# Patient Record
Sex: Female | Born: 1935 | Race: White | Hispanic: No | Smoking: Never smoker
Health system: Southern US, Community
[De-identification: ages and names within clinical notes are randomized; demographics above are authoritative.]

## PROBLEM LIST (undated history)

## (undated) DIAGNOSIS — Z9889 Other specified postprocedural states: Secondary | ICD-10-CM

## (undated) DIAGNOSIS — R42 Dizziness and giddiness: Secondary | ICD-10-CM

## (undated) DIAGNOSIS — G4733 Obstructive sleep apnea (adult) (pediatric): Secondary | ICD-10-CM

## (undated) DIAGNOSIS — E039 Hypothyroidism, unspecified: Secondary | ICD-10-CM

## (undated) DIAGNOSIS — C4490 Unspecified malignant neoplasm of skin, unspecified: Secondary | ICD-10-CM

## (undated) DIAGNOSIS — T8859XA Other complications of anesthesia, initial encounter: Secondary | ICD-10-CM

## (undated) DIAGNOSIS — J449 Chronic obstructive pulmonary disease, unspecified: Secondary | ICD-10-CM

## (undated) DIAGNOSIS — H2513 Age-related nuclear cataract, bilateral: Secondary | ICD-10-CM

## (undated) DIAGNOSIS — H18513 Endothelial corneal dystrophy, bilateral: Secondary | ICD-10-CM

## (undated) DIAGNOSIS — I517 Cardiomegaly: Secondary | ICD-10-CM

## (undated) DIAGNOSIS — I4891 Unspecified atrial fibrillation: Secondary | ICD-10-CM

## (undated) DIAGNOSIS — I10 Essential (primary) hypertension: Secondary | ICD-10-CM

## (undated) DIAGNOSIS — M1990 Unspecified osteoarthritis, unspecified site: Secondary | ICD-10-CM

## (undated) DIAGNOSIS — R112 Nausea with vomiting, unspecified: Secondary | ICD-10-CM

## (undated) DIAGNOSIS — M419 Scoliosis, unspecified: Secondary | ICD-10-CM

## (undated) DIAGNOSIS — E785 Hyperlipidemia, unspecified: Secondary | ICD-10-CM

## (undated) DIAGNOSIS — C801 Malignant (primary) neoplasm, unspecified: Secondary | ICD-10-CM

## (undated) DIAGNOSIS — R0609 Other forms of dyspnea: Secondary | ICD-10-CM

## (undated) DIAGNOSIS — H353190 Nonexudative age-related macular degeneration, unspecified eye, stage unspecified: Secondary | ICD-10-CM

## (undated) DIAGNOSIS — Z7901 Long term (current) use of anticoagulants: Secondary | ICD-10-CM

## (undated) DIAGNOSIS — T4145XA Adverse effect of unspecified anesthetic, initial encounter: Secondary | ICD-10-CM

## (undated) HISTORY — PX: JOINT REPLACEMENT: SHX530

## (undated) HISTORY — PX: BUNIONECTOMY: SHX129

## (undated) HISTORY — PX: OTHER SURGICAL HISTORY: SHX169

## (undated) HISTORY — PX: FRACTURE SURGERY: SHX138

## (undated) HISTORY — PX: CATARACT EXTRACTION, BILATERAL: SHX1313

## (undated) HISTORY — PX: EYE SURGERY: SHX253

---

## 2017-07-05 ENCOUNTER — Other Ambulatory Visit: Payer: Self-pay | Admitting: Orthopedic Surgery

## 2017-07-05 DIAGNOSIS — S46009S Unspecified injury of muscle(s) and tendon(s) of the rotator cuff of unspecified shoulder, sequela: Secondary | ICD-10-CM

## 2017-07-05 DIAGNOSIS — M19011 Primary osteoarthritis, right shoulder: Secondary | ICD-10-CM

## 2017-07-20 ENCOUNTER — Ambulatory Visit
Admission: RE | Admit: 2017-07-20 | Discharge: 2017-07-20 | Disposition: A | Discharge status: Home / Self Care | Payer: Medicare Other | Source: Ambulatory Visit | Attending: Orthopedic Surgery | Admitting: Orthopedic Surgery

## 2017-07-20 DIAGNOSIS — M19011 Primary osteoarthritis, right shoulder: Secondary | ICD-10-CM | POA: Insufficient documentation

## 2017-07-20 DIAGNOSIS — S46009S Unspecified injury of muscle(s) and tendon(s) of the rotator cuff of unspecified shoulder, sequela: Secondary | ICD-10-CM

## 2017-07-20 DIAGNOSIS — M25411 Effusion, right shoulder: Secondary | ICD-10-CM | POA: Insufficient documentation

## 2017-07-27 ENCOUNTER — Ambulatory Visit: Payer: Self-pay | Admitting: Orthopedic Surgery

## 2017-08-10 ENCOUNTER — Encounter
Admission: RE | Admit: 2017-08-10 | Discharge: 2017-08-10 | Disposition: A | Discharge status: Home / Self Care | Payer: Medicare Other | Source: Ambulatory Visit | Attending: Orthopedic Surgery | Admitting: Orthopedic Surgery

## 2017-08-10 ENCOUNTER — Other Ambulatory Visit: Payer: Self-pay

## 2017-08-10 ENCOUNTER — Encounter: Payer: Self-pay | Admitting: *Deleted

## 2017-08-10 DIAGNOSIS — Z01812 Encounter for preprocedural laboratory examination: Secondary | ICD-10-CM | POA: Insufficient documentation

## 2017-08-10 DIAGNOSIS — Z01818 Encounter for other preprocedural examination: Secondary | ICD-10-CM | POA: Insufficient documentation

## 2017-08-10 HISTORY — DX: Other specified postprocedural states: Z98.890

## 2017-08-10 HISTORY — DX: Malignant (primary) neoplasm, unspecified: C80.1

## 2017-08-10 HISTORY — DX: Chronic obstructive pulmonary disease, unspecified: J44.9

## 2017-08-10 HISTORY — DX: Essential (primary) hypertension: I10

## 2017-08-10 HISTORY — DX: Other specified postprocedural states: R11.2

## 2017-08-10 HISTORY — DX: Scoliosis, unspecified: M41.9

## 2017-08-10 HISTORY — DX: Adverse effect of unspecified anesthetic, initial encounter: T41.45XA

## 2017-08-10 HISTORY — DX: Unspecified osteoarthritis, unspecified site: M19.90

## 2017-08-10 HISTORY — DX: Hypothyroidism, unspecified: E03.9

## 2017-08-10 HISTORY — DX: Other complications of anesthesia, initial encounter: T88.59XA

## 2017-08-10 LAB — URINALYSIS, ROUTINE W REFLEX MICROSCOPIC
BACTERIA UA: NONE SEEN
BILIRUBIN URINE: NEGATIVE
Glucose, UA: NEGATIVE mg/dL
Ketones, ur: NEGATIVE mg/dL
Leukocytes, UA: NEGATIVE
Nitrite: NEGATIVE
PROTEIN: NEGATIVE mg/dL
Specific Gravity, Urine: 1.011 (ref 1.005–1.030)
pH: 6 (ref 5.0–8.0)

## 2017-08-10 LAB — BASIC METABOLIC PANEL
Anion gap: 8 (ref 5–15)
BUN: 21 mg/dL — AB (ref 6–20)
CALCIUM: 9 mg/dL (ref 8.9–10.3)
CO2: 27 mmol/L (ref 22–32)
CREATININE: 0.9 mg/dL (ref 0.44–1.00)
Chloride: 103 mmol/L (ref 101–111)
GFR calc Af Amer: >60 mL/min (ref >60)
GFR, EST NON AFRICAN AMERICAN: 59 mL/min — AB (ref >60)
Glucose, Bld: 98 mg/dL (ref 65–99)
Potassium: 4.1 mmol/L (ref 3.5–5.1)
SODIUM: 138 mmol/L (ref 135–145)

## 2017-08-10 LAB — CBC
HEMATOCRIT: 46.8 % (ref 35.0–47.0)
HEMOGLOBIN: 15.6 g/dL (ref 12.0–16.0)
MCH: 29.9 pg (ref 26.0–34.0)
MCHC: 33.4 g/dL (ref 32.0–36.0)
MCV: 89.7 fL (ref 80.0–100.0)
Platelets: 196 10*3/uL (ref 150–440)
RBC: 5.21 MIL/uL — ABNORMAL HIGH (ref 3.80–5.20)
RDW: 15 % — ABNORMAL HIGH (ref 11.5–14.5)
WBC: 5.4 10*3/uL (ref 3.6–11.0)

## 2017-08-10 LAB — PROTIME-INR
INR: 0.88
PROTHROMBIN TIME: 11.9 s (ref 11.4–15.2)

## 2017-08-10 LAB — APTT: aPTT: 26 seconds (ref 24–36)

## 2017-08-10 NOTE — Patient Instructions (Signed)
Your procedure is scheduled on: 08/22/17 Report to Day Surgery. [facility]OND FLOOR To find out your arrival time please call [phone] between 1PM - 3PM on 08/19/17.  Remember: Instructions that are not followed completely may result in serious medical risk, up to and including death, or upon the discretion of your surgeon and anesthesiologist your surgery may need to be rescheduled.     _X__ 1. Do not eat food after midnight the night before your procedure.                 No gum chewing or hard candies. You may drink clear liquids up to 2 hours                 before you are scheduled to arrive for your surgery- DO not drink clear                 liquids within 2 hours of the start of your surgery.                 Clear Liquids include:  water, apple juice without pulp, clear carbohydrate                 drink such as Clearfast of Gartorade, Black Coffee or Tea (Do not add                 anything to coffee or tea).  __X__2.  On the morning of surgery brush your teeth with toothpaste and water, you                 may rinse your mouth with mouthwash if you wish.  Do not swallow any              toothpaste of mouthwash.     _X__ 3.  No Alcohol for 24 hours before or after surgery.   _X__ 4.  Do Not Smoke or use e-cigarettes For 24 Hours Prior to Your Surgery.                 Do not use any chewable tobacco products for at least 6 hours prior to                 surgery.  ____  5.  Bring all medications with you on the day of surgery if instructed.   __X__  6.  Notify your doctor if there is any change in your medical condition      (cold, fever, infections).     Do not wear jewelry, make-up, hairpins, clips or nail polish. Do not wear lotions, powders, or perfumes. You may wear deodorant. Do not shave 48 hours prior to surgery. Men may shave face and neck. Do not bring valuables to the hospital.    [facility] is not responsible for any belongings or  valuables.  Contacts, dentures or bridgework may not be worn into surgery. Leave your suitcase in the car. After surgery it may be brought to your room. For patients admitted to the hospital, discharge time is determined by your treatment team.   Patients discharged the day of surgery will not be allowed to drive home.   Please read over the following fact sheets that you were given:   Surgical Site Infection Prevention   _X ___ Take these medicines the morning of surgery with A SIP OF WATER:    1. LEVOTHYROXINE  2.   3.   4.  5.  6.  ____  Fleet Enema (as directed)   _X___ Use CHG Soap as directed  __X__ Use inhalers on the day of surgery  AND BRING  ____ Stop metformin 2 days prior to surgery    ____ Take 1/2 of usual insulin dose the night before surgery. No insulin the morning          of surgery.   _X___ Stop Coumadin/Plavix/aspirin on   STOP ASPIRIN 1 WEEK PREOP  _X___ Stop Anti-inflammatories on  STOP IBUPROFEN AND MOBIC 1 WEEK PREOP   ____ Stop supplements until after surgery.    ____ Bring C-Pap to the hospital.

## 2017-08-10 NOTE — Anesthesia Pain Management Evaluation Note (Signed)
CLEARANCE NOTE FROM PULMONARY AND CARDIAC ON CHART. 06/02/17 STRESS  And 2/29/29 echo in care everywhere [facility] under other tab

## 2017-08-21 MED ORDER — CEFAZOLIN SODIUM-DEXTROSE 2-4 GM/100ML-% IV SOLN
2.0000 g | INTRAVENOUS | Status: AC
  Administered 2017-08-22: 2 g via INTRAVENOUS

## 2017-08-21 MED ORDER — TRANEXAMIC ACID 1000 MG/10ML IV SOLN
1000.0000 mg | INTRAVENOUS | Status: AC
  Administered 2017-08-22: 1000 mg via INTRAVENOUS
  Filled 2017-08-21: qty 10

## 2017-08-22 ENCOUNTER — Inpatient Hospital Stay: Payer: Medicare Other

## 2017-08-22 ENCOUNTER — Inpatient Hospital Stay: Payer: Medicare Other | Admitting: Anesthesiology

## 2017-08-22 ENCOUNTER — Encounter
Admission: RE | Disposition: A | Discharge status: Home / Self Care | Payer: Self-pay | Source: Home / Self Care | Attending: Orthopedic Surgery

## 2017-08-22 ENCOUNTER — Other Ambulatory Visit: Payer: Self-pay

## 2017-08-22 ENCOUNTER — Inpatient Hospital Stay
Admission: RE | Admit: 2017-08-22 | Discharge: 2017-08-23 | DRG: 483 | Disposition: A | Discharge status: Home / Self Care | Payer: Medicare Other | Attending: Orthopedic Surgery | Admitting: Orthopedic Surgery

## 2017-08-22 DIAGNOSIS — Z09 Encounter for follow-up examination after completed treatment for conditions other than malignant neoplasm: Secondary | ICD-10-CM

## 2017-08-22 DIAGNOSIS — Z885 Allergy status to narcotic agent status: Secondary | ICD-10-CM

## 2017-08-22 DIAGNOSIS — J449 Chronic obstructive pulmonary disease, unspecified: Secondary | ICD-10-CM | POA: Diagnosis present

## 2017-08-22 DIAGNOSIS — E039 Hypothyroidism, unspecified: Secondary | ICD-10-CM | POA: Diagnosis present

## 2017-08-22 DIAGNOSIS — M25511 Pain in right shoulder: Secondary | ICD-10-CM | POA: Diagnosis present

## 2017-08-22 DIAGNOSIS — I10 Essential (primary) hypertension: Secondary | ICD-10-CM | POA: Diagnosis present

## 2017-08-22 DIAGNOSIS — M19011 Primary osteoarthritis, right shoulder: Secondary | ICD-10-CM | POA: Diagnosis present

## 2017-08-22 DIAGNOSIS — Z96611 Presence of right artificial shoulder joint: Secondary | ICD-10-CM

## 2017-08-22 DIAGNOSIS — Z881 Allergy status to other antibiotic agents status: Secondary | ICD-10-CM

## 2017-08-22 DIAGNOSIS — M4156 Other secondary scoliosis, lumbar region: Secondary | ICD-10-CM | POA: Diagnosis present

## 2017-08-22 HISTORY — PX: REVERSE SHOULDER ARTHROPLASTY: SHX5054

## 2017-08-22 SURGERY — ARTHROPLASTY, SHOULDER, TOTAL, REVERSE
Anesthesia: General | Laterality: Right

## 2017-08-22 MED ORDER — ALBUTEROL SULFATE (2.5 MG/3ML) 0.083% IN NEBU: 2.5000 mg | INHALATION_SOLUTION | Freq: Four times a day (QID) | RESPIRATORY_TRACT | Status: DC | PRN

## 2017-08-22 MED ORDER — MORPHINE SULFATE (PF) 2 MG/ML IV SOLN: 0.5000 mg | INTRAVENOUS | Status: DC | PRN

## 2017-08-22 MED ORDER — ONDANSETRON HCL 4 MG PO TABS: 4.0000 mg | ORAL_TABLET | Freq: Four times a day (QID) | ORAL | Status: DC | PRN

## 2017-08-22 MED ORDER — FENTANYL CITRATE (PF) 100 MCG/2ML IJ SOLN
INTRAMUSCULAR | Status: AC
  Administered 2017-08-22: 50 ug
  Filled 2017-08-22: qty 2

## 2017-08-22 MED ORDER — LIDOCAINE HCL (CARDIAC) PF 100 MG/5ML IV SOSY
PREFILLED_SYRINGE | INTRAVENOUS | Status: DC | PRN
  Administered 2017-08-22: 40 mg via INTRAVENOUS

## 2017-08-22 MED ORDER — PROPOFOL 500 MG/50ML IV EMUL
INTRAVENOUS | Status: AC
  Filled 2017-08-22: qty 50

## 2017-08-22 MED ORDER — ALBUTEROL SULFATE HFA 108 (90 BASE) MCG/ACT IN AERS
INHALATION_SPRAY | RESPIRATORY_TRACT | Status: AC
  Filled 2017-08-22: qty 6.7

## 2017-08-22 MED ORDER — LIDOCAINE HCL (PF) 1 % IJ SOLN
INTRAMUSCULAR | Status: AC
  Filled 2017-08-22: qty 5

## 2017-08-22 MED ORDER — ALBUTEROL SULFATE HFA 108 (90 BASE) MCG/ACT IN AERS: 2.0000 | INHALATION_SPRAY | Freq: Four times a day (QID) | RESPIRATORY_TRACT | Status: DC | PRN

## 2017-08-22 MED ORDER — PHENYLEPHRINE HCL 10 MG/ML IJ SOLN
INTRAMUSCULAR | Status: DC | PRN
  Administered 2017-08-22 (×2): 100 ug via INTRAVENOUS
  Administered 2017-08-22: 200 ug via INTRAVENOUS

## 2017-08-22 MED ORDER — FENTANYL CITRATE (PF) 100 MCG/2ML IJ SOLN: 25.0000 ug | INTRAMUSCULAR | Status: DC | PRN

## 2017-08-22 MED ORDER — GABAPENTIN 300 MG PO CAPS
300.0000 mg | ORAL_CAPSULE | Freq: Three times a day (TID) | ORAL | Status: DC
  Administered 2017-08-22 – 2017-08-23 (×5): 300 mg via ORAL
  Filled 2017-08-22 (×5): qty 1

## 2017-08-22 MED ORDER — SODIUM CHLORIDE 0.9 % IR SOLN
Status: DC | PRN
  Administered 2017-08-22: 20 mL

## 2017-08-22 MED ORDER — SEVOFLURANE IN SOLN
RESPIRATORY_TRACT | Status: AC
  Filled 2017-08-22: qty 250

## 2017-08-22 MED ORDER — OXYCODONE HCL 5 MG PO TABS
5.0000 mg | ORAL_TABLET | ORAL | Status: DC | PRN
  Administered 2017-08-23: 5 mg via ORAL
  Filled 2017-08-22: qty 1

## 2017-08-22 MED ORDER — CEFAZOLIN SODIUM-DEXTROSE 2-4 GM/100ML-% IV SOLN
2.0000 g | Freq: Four times a day (QID) | INTRAVENOUS | Status: AC
  Administered 2017-08-22 – 2017-08-23 (×3): 2 g via INTRAVENOUS
  Filled 2017-08-22 (×3): qty 100

## 2017-08-22 MED ORDER — ROCURONIUM BROMIDE 50 MG/5ML IV SOLN
INTRAVENOUS | Status: AC
  Filled 2017-08-22: qty 1

## 2017-08-22 MED ORDER — METOCLOPRAMIDE HCL 10 MG PO TABS: 5.0000 mg | ORAL_TABLET | Freq: Three times a day (TID) | ORAL | Status: DC | PRN

## 2017-08-22 MED ORDER — PHENYLEPHRINE HCL 10 MG/ML IJ SOLN
INTRAMUSCULAR | Status: AC
  Filled 2017-08-22: qty 1

## 2017-08-22 MED ORDER — LEVOTHYROXINE SODIUM 50 MCG PO TABS
50.0000 ug | ORAL_TABLET | Freq: Every day | ORAL | Status: DC
  Administered 2017-08-23: 50 ug via ORAL
  Filled 2017-08-22: qty 1

## 2017-08-22 MED ORDER — SODIUM CHLORIDE 0.9 % IV SOLN
INTRAVENOUS | Status: DC | PRN
  Administered 2017-08-22: 50 ug/min via INTRAVENOUS

## 2017-08-22 MED ORDER — OXYMETAZOLINE HCL 0.05 % NA SOLN
1.0000 | NASAL | Status: DC | PRN
  Filled 2017-08-22: qty 15

## 2017-08-22 MED ORDER — BUPIVACAINE-EPINEPHRINE (PF) 0.25% -1:200000 IJ SOLN
INTRAMUSCULAR | Status: DC | PRN
  Administered 2017-08-22: 12 mL via PERINEURAL

## 2017-08-22 MED ORDER — BUPIVACAINE-EPINEPHRINE (PF) 0.25% -1:200000 IJ SOLN
INTRAMUSCULAR | Status: AC
  Filled 2017-08-22: qty 30

## 2017-08-22 MED ORDER — PROPOFOL 500 MG/50ML IV EMUL
INTRAVENOUS | Status: DC | PRN
  Administered 2017-08-22: 20 ug/kg/min via INTRAVENOUS

## 2017-08-22 MED ORDER — SUCCINYLCHOLINE CHLORIDE 20 MG/ML IJ SOLN
INTRAMUSCULAR | Status: AC
  Filled 2017-08-22: qty 1

## 2017-08-22 MED ORDER — MECLIZINE HCL 25 MG PO TABS
25.0000 mg | ORAL_TABLET | Freq: Three times a day (TID) | ORAL | Status: DC | PRN
Start: 2017-08-22 — End: 2017-08-23
  Administered 2017-08-23: 25 mg via ORAL
  Filled 2017-08-22 (×2): qty 1

## 2017-08-22 MED ORDER — BACITRACIN 50000 UNITS IM SOLR
INTRAMUSCULAR | Status: AC
  Filled 2017-08-22: qty 2

## 2017-08-22 MED ORDER — FENTANYL CITRATE (PF) 100 MCG/2ML IJ SOLN
INTRAMUSCULAR | Status: DC | PRN
  Administered 2017-08-22: 100 ug via INTRAVENOUS

## 2017-08-22 MED ORDER — DEXAMETHASONE SODIUM PHOSPHATE 10 MG/ML IJ SOLN
INTRAMUSCULAR | Status: AC
  Filled 2017-08-22: qty 1

## 2017-08-22 MED ORDER — ROPIVACAINE HCL 5 MG/ML IJ SOLN
INTRAMUSCULAR | Status: DC | PRN
  Administered 2017-08-22: 30 mL via EPIDURAL

## 2017-08-22 MED ORDER — FENTANYL CITRATE (PF) 100 MCG/2ML IJ SOLN
INTRAMUSCULAR | Status: AC
  Filled 2017-08-22: qty 2

## 2017-08-22 MED ORDER — ACETAMINOPHEN 500 MG PO TABS
1000.0000 mg | ORAL_TABLET | Freq: Once | ORAL | Status: AC
  Administered 2017-08-22: 1000 mg via ORAL

## 2017-08-22 MED ORDER — GABAPENTIN 300 MG PO CAPS
300.0000 mg | ORAL_CAPSULE | Freq: Once | ORAL | Status: AC
  Administered 2017-08-22: 300 mg via ORAL

## 2017-08-22 MED ORDER — LACTATED RINGERS IV SOLN
INTRAVENOUS | Status: DC
  Administered 2017-08-22 – 2017-08-23 (×2): via INTRAVENOUS

## 2017-08-22 MED ORDER — ONDANSETRON HCL 4 MG/2ML IJ SOLN
4.0000 mg | Freq: Four times a day (QID) | INTRAMUSCULAR | Status: DC | PRN
Start: 2017-08-22 — End: 2017-08-23

## 2017-08-22 MED ORDER — ONDANSETRON HCL 4 MG/2ML IJ SOLN
INTRAMUSCULAR | Status: DC | PRN
  Administered 2017-08-22: 4 mg via INTRAVENOUS

## 2017-08-22 MED ORDER — CHLORHEXIDINE GLUCONATE 4 % EX LIQD: 60.0000 mL | Freq: Once | CUTANEOUS | Status: DC

## 2017-08-22 MED ORDER — PROPOFOL 10 MG/ML IV BOLUS
INTRAVENOUS | Status: DC | PRN
  Administered 2017-08-22: 120 mg via INTRAVENOUS

## 2017-08-22 MED ORDER — ONDANSETRON HCL 4 MG/2ML IJ SOLN: 4.0000 mg | Freq: Once | INTRAMUSCULAR | Status: DC | PRN

## 2017-08-22 MED ORDER — ALBUTEROL SULFATE HFA 108 (90 BASE) MCG/ACT IN AERS
INHALATION_SPRAY | RESPIRATORY_TRACT | Status: DC | PRN
  Administered 2017-08-22: 6 via RESPIRATORY_TRACT

## 2017-08-22 MED ORDER — SUGAMMADEX SODIUM 200 MG/2ML IV SOLN
INTRAVENOUS | Status: AC
  Filled 2017-08-22: qty 2

## 2017-08-22 MED ORDER — ASPIRIN EC 81 MG PO TBEC
81.0000 mg | DELAYED_RELEASE_TABLET | Freq: Every day | ORAL | Status: DC
  Administered 2017-08-22 – 2017-08-23 (×2): 81 mg via ORAL
  Filled 2017-08-22 (×2): qty 1

## 2017-08-22 MED ORDER — ROPIVACAINE HCL 5 MG/ML IJ SOLN
INTRAMUSCULAR | Status: AC
  Filled 2017-08-22: qty 30

## 2017-08-22 MED ORDER — SUGAMMADEX SODIUM 200 MG/2ML IV SOLN
INTRAVENOUS | Status: DC | PRN
  Administered 2017-08-22: 200 mg via INTRAVENOUS

## 2017-08-22 MED ORDER — ACETAMINOPHEN 500 MG PO TABS
500.0000 mg | ORAL_TABLET | Freq: Four times a day (QID) | ORAL | Status: AC
  Administered 2017-08-22 – 2017-08-23 (×3): 500 mg via ORAL
  Filled 2017-08-22 (×4): qty 1

## 2017-08-22 MED ORDER — SODIUM CHLORIDE FLUSH 0.9 % IV SOLN
INTRAVENOUS | Status: AC
  Filled 2017-08-22: qty 40

## 2017-08-22 MED ORDER — FAMOTIDINE 20 MG PO TABS
20.0000 mg | ORAL_TABLET | Freq: Once | ORAL | Status: AC
  Administered 2017-08-22: 20 mg via ORAL

## 2017-08-22 MED ORDER — DEXAMETHASONE SODIUM PHOSPHATE 10 MG/ML IJ SOLN
INTRAMUSCULAR | Status: DC | PRN
  Administered 2017-08-22: 10 mg via INTRAVENOUS

## 2017-08-22 MED ORDER — METOCLOPRAMIDE HCL 5 MG/ML IJ SOLN: 5.0000 mg | Freq: Three times a day (TID) | INTRAMUSCULAR | Status: DC | PRN

## 2017-08-22 MED ORDER — ROCURONIUM BROMIDE 100 MG/10ML IV SOLN
INTRAVENOUS | Status: DC | PRN
  Administered 2017-08-22: 30 mg via INTRAVENOUS
  Administered 2017-08-22: 20 mg via INTRAVENOUS

## 2017-08-22 MED ORDER — DOCUSATE SODIUM 100 MG PO CAPS
100.0000 mg | ORAL_CAPSULE | Freq: Two times a day (BID) | ORAL | Status: DC
  Administered 2017-08-22 – 2017-08-23 (×3): 100 mg via ORAL
  Filled 2017-08-22 (×3): qty 1

## 2017-08-22 MED ORDER — KETOROLAC TROMETHAMINE 15 MG/ML IJ SOLN
7.5000 mg | Freq: Four times a day (QID) | INTRAMUSCULAR | Status: AC
  Administered 2017-08-22 – 2017-08-23 (×4): 7.5 mg via INTRAVENOUS
  Filled 2017-08-22 (×5): qty 1

## 2017-08-22 MED ORDER — PHENOL 1.4 % MT LIQD
1.0000 | OROMUCOSAL | Status: DC | PRN
  Filled 2017-08-22: qty 177

## 2017-08-22 MED ORDER — MENTHOL 3 MG MT LOZG
1.0000 | LOZENGE | OROMUCOSAL | Status: DC | PRN
  Filled 2017-08-22: qty 9

## 2017-08-22 MED ORDER — ACETAMINOPHEN 325 MG PO TABS: 325.0000 mg | ORAL_TABLET | Freq: Four times a day (QID) | ORAL | Status: DC | PRN

## 2017-08-22 MED ORDER — LACTATED RINGERS IV SOLN: INTRAVENOUS | Status: DC

## 2017-08-22 MED ORDER — LIDOCAINE HCL (PF) 2 % IJ SOLN
INTRAMUSCULAR | Status: AC
  Filled 2017-08-22: qty 10

## 2017-08-22 MED ORDER — LACTATED RINGERS IV SOLN
INTRAVENOUS | Status: DC
  Administered 2017-08-22: 1000 mL via INTRAVENOUS

## 2017-08-22 MED ORDER — PROPOFOL 500 MG/50ML IV EMUL: INTRAVENOUS | Status: DC | PRN

## 2017-08-22 MED ORDER — ONDANSETRON HCL 4 MG/2ML IJ SOLN
INTRAMUSCULAR | Status: AC
  Filled 2017-08-22: qty 2

## 2017-08-22 SURGICAL SUPPLY — 68 items
1.5 Glensphere ×2 IMPLANT
2.5 Drill Bit ×2 IMPLANT
225503003 ×2 IMPLANT
BIT DRILL 170X2.5X (BIT) ×1 IMPLANT
BIT DRL 170X2.5X (BIT) ×1
BLADE BOVIE TIP EXT 4 (BLADE) ×2 IMPLANT
BLADE SAW 1 (BLADE) ×2 IMPLANT
BNDG COHESIVE 4X5 TAN STRL (GAUZE/BANDAGES/DRESSINGS) ×2 IMPLANT
BOWL CEMENT MIX W/ADAPTER (MISCELLANEOUS) IMPLANT
BRUSH SCRUB EZ  4% CHG (MISCELLANEOUS) ×2
BRUSH SCRUB EZ 4% CHG (MISCELLANEOUS) ×2 IMPLANT
CANISTER SUCT 1200ML W/VALVE (MISCELLANEOUS) ×2 IMPLANT
CANISTER SUCT 3000ML PPV (MISCELLANEOUS) ×4 IMPLANT
CAPT SHLDR REVTOTAL 1 ×2 IMPLANT
CHLORAPREP W/TINT 26ML (MISCELLANEOUS) ×2 IMPLANT
COVER MAYO STAND STRL (DRAPES) IMPLANT
CRADLE LAMINECT ARM (MISCELLANEOUS) ×2 IMPLANT
DRAPE IMP U-DRAPE 54X76 (DRAPES) ×4 IMPLANT
DRAPE INCISE IOBAN 66X45 STRL (DRAPES) IMPLANT
DRAPE INCISE IOBAN 66X60 STRL (DRAPES) IMPLANT
DRAPE SHEET LG 3/4 BI-LAMINATE (DRAPES) ×4 IMPLANT
DRAPE TABLE BACK 80X90 (DRAPES) ×2 IMPLANT
DRAPE U-SHAPE 47X51 STRL (DRAPES) ×2 IMPLANT
DRILL 2.5 (BIT) ×1
DRSG TEGADERM 6X8 (GAUZE/BANDAGES/DRESSINGS) ×4 IMPLANT
ELECT REM PT RETURN 9FT ADLT (ELECTROSURGICAL) ×2
ELECTRODE REM PT RTRN 9FT ADLT (ELECTROSURGICAL) ×1 IMPLANT
GAUZE PETRO XEROFOAM 1X8 (MISCELLANEOUS) ×2 IMPLANT
GAUZE SPONGE 4X4 12PLY STRL (GAUZE/BANDAGES/DRESSINGS) IMPLANT
GLOVE INDICATOR 8.0 STRL GRN (GLOVE) ×2 IMPLANT
GLOVE SURG ORTHO 8.0 STRL STRW (GLOVE) ×2 IMPLANT
GOWN STRL REUS W/ TWL LRG LVL3 (GOWN DISPOSABLE) ×2 IMPLANT
GOWN STRL REUS W/ TWL XL LVL3 (GOWN DISPOSABLE) ×1 IMPLANT
GOWN STRL REUS W/TWL LRG LVL3 (GOWN DISPOSABLE) ×2
GOWN STRL REUS W/TWL XL LVL3 (GOWN DISPOSABLE) ×1
GUIDE XTEND SHOULDER (MISCELLANEOUS) ×1 IMPLANT
HOOD PEEL AWAY FLYTE STAYCOOL (MISCELLANEOUS) ×6 IMPLANT
IV NS 1000ML (IV SOLUTION) ×1
IV NS 1000ML BAXH (IV SOLUTION) ×1 IMPLANT
KIT STABILIZATION SHOULDER (MISCELLANEOUS) ×2 IMPLANT
KIT TURNOVER KIT A (KITS) ×2 IMPLANT
MASK FACE SPIDER DISP (MASK) ×2 IMPLANT
MAT BLUE FLOOR 46X72 FLO (MISCELLANEOUS) ×2 IMPLANT
NDL MAYO CATGUT SZ5 (NEEDLE)
NDL SAFETY ECLIPSE 18X1.5 (NEEDLE) IMPLANT
NDL SUT 5 .5 CRC TPR PNT MAYO (NEEDLE) IMPLANT
NEEDLE HYPO 18GX1.5 SHARP (NEEDLE)
NEEDLE MAYO 6 CRC TAPER PT (NEEDLE) ×2 IMPLANT
NS IRRIG 1000ML POUR BTL (IV SOLUTION) ×2 IMPLANT
PACK ARTHROSCOPY SHOULDER (MISCELLANEOUS) ×2 IMPLANT
PIN GUIDE GLENOPHERE 1.5MX300M (PIN) ×2 IMPLANT
PULSAVAC PLUS IRRIG FAN TIP (DISPOSABLE) ×2
SHOULDER XTEND GUIDE (MISCELLANEOUS) ×2
SLING ARM LRG DEEP (SOFTGOODS) ×2 IMPLANT
SPONGE LAP 18X18 RF (DISPOSABLE) ×2 IMPLANT
STAPLER SKIN PROX 35W (STAPLE) ×2 IMPLANT
STRAP SAFETY 5IN WIDE (MISCELLANEOUS) ×4 IMPLANT
STRIP CLOSURE SKIN 1/2X4 (GAUZE/BANDAGES/DRESSINGS) IMPLANT
SUT TICRON 2-0 30IN 311381 (SUTURE) IMPLANT
SUT VIC AB 0 CT2 27 (SUTURE) ×2 IMPLANT
SUT VIC AB 2-0 CT1 18 (SUTURE) ×4 IMPLANT
SUT VIC AB PLUS 45CM 1-MO-4 (SUTURE) IMPLANT
SYR 10ML LL (SYRINGE) ×2 IMPLANT
SYR TOOMEY 50ML (SYRINGE) ×2 IMPLANT
SYRINGE IRR TOOMEY STRL 70CC (SYRINGE) ×2 IMPLANT
TAPE SUT 30 1/2 CRC GREEN (SUTURE) ×4 IMPLANT
TIP FAN IRRIG PULSAVAC PLUS (DISPOSABLE) ×1 IMPLANT
WATER STERILE IRR 1000ML POUR (IV SOLUTION) ×4 IMPLANT

## 2017-08-22 NOTE — Transfer of Care (Signed)
Immediate Anesthesia Transfer of Care Note  Patient: Sonya Cameron  Procedure(s) Performed: REVERSE SHOULDER ARTHROPLASTY (Right )  Patient Location: PACU  Anesthesia Type:General  Level of Consciousness: drowsy  Airway & Oxygen Therapy: spontaneous, face mask   Post-op Assessment: Report given to RN and Post -op Vital signs reviewed and stable  Post vital signs: Reviewed and stable  Last Vitals:  Vitals Value Taken Time  BP 110/70 08/22/2017 10:37 AM  Temp    Pulse 74 08/22/2017 10:38 AM  Resp 21 08/22/2017 10:38 AM  SpO2 99 % 08/22/2017 10:38 AM    Last Pain:  Vitals:   08/22/17 0730  TempSrc:   PainSc: 0-No pain         Complications: No apparent anesthesia complications

## 2017-08-22 NOTE — Op Note (Signed)
08/22/2017  10:44 AM  Patient:   Sonya Cameron  Pre-Op Diagnosis:   localized primary osteoarthritis right shoulder  Post-Op Diagnosis:   Same  Procedure:   Reverse right total shoulder arthroplasty.  Surgeon:   Kurtis Bushman, MD  Assistant:   Carlynn Spry, PA-C  Anesthesia:   General endotracheal with an interscalene block placed preoperatively by the anesthesiologist.  Findings:   As above.  Complications:   None  EBL:  50 cc  Drains:   None  Closure:   Staples  Implants:   All press-fit J&J Delta Xtend Metaglene, Glenosphere Standard, Humeral PE Cup +6 mm, Eccentric Epiphysis Size small, Porocoat standard stem  Brief Clinical Note:   The patient has failed multiple conservative treatments for painful rotator tear arthropathy, including injections, medications and therapy. The patient presents at this time for a reverse right total shoulder arthroplasty.  Procedure:   The patient underwent placement of an interscalene block by the anesthesiologist in the preoperative holding area before being brought into the operating room and lain in the supine position. The patient then underwent general endotracheal intubation and anesthesia before a Foley catheter was inserted and the patient repositioned in the beach chair position using the beach chair positioner. The right shoulder and upper extremity were prepped with ChloraPrep solution before being draped sterilely. Preoperative antibiotics were administered. A standard anterior approach to the shoulder was made through an approximately 4-5 inch incision. The incision was carried down through the subcutaneous tissues to expose the deltopectoral fascia. The interval between the deltoid and pectoralis muscles was identified and this plane developed, retracting the cephalic vein laterally with the deltoid muscle. The conjoined tendon was identified. Its lateral margin was dissected and the Kolbel self-retraining retractor inserted. The "three  sisters" were identified and cauterized. Bursal tissues were removed to improve visualization. The subscapularis tendon was released from its attachment to the lesser tuberosity 1 cm proximal to its insertion and several tagging sutures placed. The inferior capsule was released with care after identifying and protecting the axillary nerve. The proximal humeral cut was made at approximately 10 of retroversion using the extra-medullary guide.   Attention was redirected to the glenoid. The labrum was debrided circumferentially before the center of the glenoid was marked with electrocautery. The guidewire was drilled into the glenoid neck using the appropriate guide. After verifying its position, it was overreamed with the mini-baseplate reamer to create a flat surface. The permanent mini-baseplate was impacted into place. It was stabilized with four peripheral screws. Locking screws were placed superiorly and inferiorly while nonlocking screws were placed anteriorly and posteriorly. The permanent 36 mm glenosphere was then impacted into place and its Morse taper locking mechanism verified using manual distraction.  Attention was directed to the humeral side. The humeral canal was reamed sequentially beginning with the end-cutting reamer then progressing from a 6 mm reamer up to a 12 mm reamer. This provided excellent circumferential chatter. The canal was broached. This was left in place and a trial reduction performed using the standard trial humeral platform. The arm demonstrated excellent range of motion as the hand could be brought across the chest to the opposite shoulder and brought to the top of the patient's head and to the patient's ear. The shoulder appeared stable throughout this range of motion. The joint was dislocated and the trial components removed. The permanent 12 mm stem was impacted into place with care taken to maintain the appropriate version. The shoulder was relocated using two finger  pressure and again placed through a range of motion with the findings as described above.  The wound was copiously irrigated with bacitracin saline solution using the jet lavage system. The subscapularis tendon was reapproximated using 1 mm cottony dacron with Mason-Allen interrupted sutures. The deltopectoral interval was closed using #0 Vicryl interrupted sutures before the subcutaneous tissues were closed using 2-0 Vicryl interrupted sutures. The skin was closed using staples. A sterile occlusive dressing was applied to the wound before the arm was placed into a shoulder sling. The patient was then transferred back to a hospital bed before being awakened, extubated, and returned to the recovery room in satisfactory condition after tolerating the procedure well.

## 2017-08-22 NOTE — Anesthesia Procedure Notes (Signed)
Anesthesia Regional Block: Interscalene brachial plexus block   Pre-Anesthetic Checklist: ,, timeout performed, Correct Patient, Correct Site, Correct Laterality, Correct Procedure, Correct Position, site marked, Risks and benefits discussed,  Surgical consent,  Pre-op evaluation,  At surgeon's request and post-op pain management  Laterality: Right  Prep: Maximum Sterile Barrier Precautions used, chloraprep, alcohol swabs       Needles:  Injection technique: Single-shot  Needle Type: Stimiplex     Needle Length: 5cm  Needle Gauge: 21     Additional Needles:   Procedures:, nerve stimulator,,, ultrasound used (permanent image in chart),,,,   Nerve Stimulator or Paresthesia:  Response: biceps flexion, 0.5 mA,   Additional Responses:   Narrative:  Start time: 08/22/2017 7:20 AM End time: 08/22/2017 7:25 AM Injection made incrementally with aspirations every 5 mL.  Performed by: Personally  Anesthesiologist: Alvin Critchley, MD  Additional Notes: Time out was called.  The patient was in semireclining position with a roll under her right shoulder.  The patient was adequately medicated and the right neck was prepped and draped in sterile fashion.  A US probe was used to ID the nerves in a typical stoplight position.  A skin wheal was made lateral to the US probe with 1% Lidocaine plain.  A 21 G stimuplex needle was advanced to the plexus with a fade at 0.5 mAmps.  Incremental injection of 30 cc of 0.5% Ropivacaine plain.  The injection was easy with no resistance and no pain on injection.  The patient tolerated the procedure well.

## 2017-08-22 NOTE — Progress Notes (Signed)
Pt alert and oriented X4 up in chair. IV infusing LR at 65ml/hr. Pt on 2L O2 acute. Scheduled pain medication given. Right arm in sling. Ice pack applied. No complaints at this time.

## 2017-08-22 NOTE — Anesthesia Procedure Notes (Signed)
Procedure Name: Intubation Date/Time: 08/22/2017 7:45 AM Performed by: Jonna Clark, CRNA Pre-anesthesia Checklist: Patient identified, Patient being monitored, Timeout performed, Emergency Drugs available and Suction available Patient Re-evaluated:Patient Re-evaluated prior to induction Oxygen Delivery Method: Circle system utilized Preoxygenation: Pre-oxygenation with 100% oxygen Induction Type: IV induction Ventilation: Mask ventilation without difficulty Laryngoscope Size: Mac and 3 Grade View: Grade I Tube type: Oral Tube size: 7.0 mm Number of attempts: 1 Airway Equipment and Method: Stylet Placement Confirmation: ETT inserted through vocal cords under direct vision,  positive ETCO2 and breath sounds checked- equal and bilateral Secured at: 21 cm Tube secured with: Tape Dental Injury: Teeth and Oropharynx as per pre-operative assessment

## 2017-08-22 NOTE — H&P (Signed)
The patient has been re-examined, and the chart reviewed, and there have been no interval changes to the documented history and physical.  Plan a right reverse shoulder arthroplasty today.  Anesthesia is consulted regarding a peripheral nerve block for post-operative pain.  The risks, benefits, and alternatives have been discussed at length, and the patient is willing to proceed.

## 2017-08-22 NOTE — Anesthesia Preprocedure Evaluation (Signed)
Anesthesia Evaluation  Patient identified by MRN, date of birth, ID band Patient awake    Reviewed: Allergy & Precautions, NPO status , Patient's Chart, lab work & pertinent test results  History of Anesthesia Complications (+) PONV and history of anesthetic complications  Airway Mallampati: II  TM Distance: >3 FB     Dental   Pulmonary COPD,  COPD inhaler,    Pulmonary exam normal        Cardiovascular hypertension, Normal cardiovascular exam     Neuro/Psych negative neurological ROS  negative psych ROS   GI/Hepatic negative GI ROS, Neg liver ROS,   Endo/Other  Hypothyroidism   Renal/GU negative Renal ROS  negative genitourinary   Musculoskeletal  (+) Arthritis , Osteoarthritis,    Abdominal Normal abdominal exam  (+)   Peds negative pediatric ROS (+)  Hematology negative hematology ROS (+)   Anesthesia Other Findings   Reproductive/Obstetrics                             Anesthesia Physical Anesthesia Plan  ASA: III  Anesthesia Plan: General   Post-op Pain Management:  Regional for Post-op pain   Induction: Intravenous  PONV Risk Score and Plan:   Airway Management Planned: Oral ETT  Additional Equipment:   Intra-op Plan:   Post-operative Plan: Extubation in OR  Informed Consent: I have reviewed the patients History and Physical, chart, labs and discussed the procedure including the risks, benefits and alternatives for the proposed anesthesia with the patient or authorized representative who has indicated his/her understanding and acceptance.   Dental advisory given  Plan Discussed with: CRNA and Surgeon  Anesthesia Plan Comments:         Anesthesia Quick Evaluation

## 2017-08-22 NOTE — Progress Notes (Addendum)
Physical Therapy Evaluation Patient Details Name: Sonya Cameron MRN: [id number] DOB: 1934-11-05 Today's Date: 08/22/2017   History of Present Illness  Pt underwent elective R reverse TSR without reported post-op complications. PMH includes COPD, OA, hypothyroidims, and HTN. PT evaluation performed on POD#0.  Clinical Impression  Pt admitted with above diagnosis. Pt currently with functional limitations due to the deficits listed below (see PT Problem List). Pt is modified independent with bed mobility and requires increased time to perform as well as assist from bed rail. Pt able to stand without external assist from therapist. She is steady in standing without assistive device however does require increase in time to come to standing. Attempted to take a few steps however pt immediately reports that she feels dizzy so deferred ambulation/stairs until next session. Pt assisted to recliner. Orthostatic vitals already obtained during position changes prior to walking and were negative. Pt reports continued numbness/tingling in RUE at time of PT evaluation. Issued handout but deferred exercises with patient until next session once she has full sensation. Pt will benefit from PT services to address deficits in strength, balance, and mobility in order to return to full function at home.      Follow Up Recommendations Outpatient PT    Equipment Recommendations  None recommended by PT;Other (comment)(May need a tub bench to assist shower transfers)    Recommendations for Other Services       Precautions / Restrictions Precautions Precautions: Shoulder Type of Shoulder Precautions: No AROM of shoulder, no combined add/IR Shoulder Interventions: Shoulder sling/immobilizer Precaution Booklet Issued: Yes (comment) Required Braces or Orthoses: Sling Restrictions Weight Bearing Restrictions: Yes RUE Weight Bearing: Non weight bearing      Mobility  Bed Mobility Overal bed mobility: Modified  Independent             General bed mobility comments: Use of L bed rail when exiting toward L side. Increase in time and effor but able to perform without external assist. HOB elevated  Transfers Overall transfer level: Needs assistance Equipment used: Straight cane Transfers: Sit to/from Stand Sit to Stand: Min guard         General transfer comment: Pt able to stand without external assist from therapist. She is steady in standing without assistive device. Increase in time required. Attempted to take a few steps however pt immediately reports that she feels dizzy so deferred until next session. Pt assisted to recliner. Orthostatic vitals already obtained during position changes prior to walking and were negative.   Ambulation/Gait             General Gait Details: Deferred on this date due to dizziness/lightheadedness in standing  Stairs            Wheelchair Mobility    Modified Rankin (Stroke Patients Only)       Balance Overall balance assessment: Needs assistance Sitting-balance support: No upper extremity supported Sitting balance-Leahy Scale: Good     Standing balance support: No upper extremity supported Standing balance-Leahy Scale: Fair                               Pertinent Vitals/Pain Pain Assessment: 0-10 Pain Score: 3  Pain Location: Reports general lower body pain. Will not answer question directly when asked about shoulder pain Pain Intervention(s): Monitored during session    [place] expects to be discharged to:: Private residence Living Arrangements: Children(Daughter) Available Help at Discharge: Family Type of Home:  House Home Access: Level entry     Home Layout: Multi-level;Able to live on main level with bedroom/bathroom Home Equipment: Gilford Rile - 2 wheels;Cane - single point;Bedside commode      Prior Function Level of Independence: Needs assistance   Gait / Transfers Assistance Needed:  Modified independent ambulation with single point cane in RUE prior to surgery. 2 falls in the last 12 months  ADL's / Homemaking Assistance Needed: Mostly independent with ADLs but requires intermittent assist from daughter washing her back. Requires assist with IADLs        Hand Dominance   Dominant Hand: Right    Extremity/Trunk Assessment   Upper Extremity Assessment Upper Extremity Assessment: RUE deficits/detail RUE Deficits / Details: Pt reports numbness/tingling in R hand during session. R grip strength is strong and pt able to flex and extend all digits of R hand. LUE has functional strength during bed mobility and transfers    Lower Extremity Assessment Lower Extremity Assessment: Overall WFL for tasks assessed       Communication   Communication: No difficulties  Cognition Arousal/Alertness: Lethargic;Suspect due to medications Behavior During Therapy: [facility] for tasks assessed/performed Overall Cognitive Status: Within Functional Limits for tasks assessed                                        General Comments      Exercises     Assessment/Plan    PT Assessment Patient needs continued PT services  PT Problem List Decreased strength;Decreased range of motion;Decreased activity tolerance;Decreased balance;Decreased mobility       PT Treatment Interventions DME instruction;Gait training;Stair training;Functional mobility training;Therapeutic activities;Therapeutic exercise;Balance training;Neuromuscular re-education    PT Goals (Current goals can be found in the Care Plan section)  Acute Rehab PT Goals Patient Stated Goal: Return to prior level of function at home PT Goal Formulation: With patient Time For Goal Achievement: 09/05/17 Potential to Achieve Goals: Good    Frequency BID   Barriers to discharge        Co-evaluation               AM-PAC PT "6 Clicks" Daily Activity  Outcome Measure Difficulty turning over in bed  (including adjusting bedclothes, sheets and blankets)?: A Little Difficulty moving from lying on back to sitting on the side of the bed? : A Little Difficulty sitting down on and standing up from a chair with arms (e.g., wheelchair, bedside commode, etc,.)?: A Little Help needed moving to and from a bed to chair (including a wheelchair)?: A Little Help needed walking in hospital room?: A Lot Help needed climbing 3-5 steps with a railing? : A Lot 6 Click Score: 16    End of Session Equipment Utilized During Treatment: Gait belt Activity Tolerance: Other (comment)(Limited by lightheadedness and lethargy) Patient left: in chair;with call bell/phone within reach;with chair alarm set;with family/visitor present;with SCD's reapplied   PT Visit Diagnosis: Unsteadiness on feet (R26.81);History of falling (Z91.81);Pain Pain - Right/Left: Right Pain - part of body: Shoulder    Time: 1607-3710 PT Time Calculation (min) (ACUTE ONLY): 33 min   Charges:   PT Evaluation $PT Eval Low Complexity: 1 Low     PT G Codes:        Lyndel Safe Ellie Bryand PT, DPT    Gael Delude 08/22/2017, 2:56 PM

## 2017-08-22 NOTE — Anesthesia Post-op Follow-up Note (Signed)
Anesthesia QCDR form completed.        

## 2017-08-22 NOTE — Progress Notes (Signed)
Chaplain responded to an OR for an AD. Pt said she was dozing as I entered. She said she told the team she had an AD and said she didn't need one. Pt said she wanted to get som rest. Chaplain help Pt find her remote for the TV. The TV was on [employer]. She said she watches that when nothing else to watch. Chaplain stated to engage Pt  asking about her garden. Pt said she used to have a garden and took her cucumbers to the farmers market  But found they lasted longer when pickled. The NT entered room and pt s attention was distracted. Will come by again to talk about her garden.    08/22/17 1300  Clinical Encounter Type  Visited With Patient  Visit Type Spiritual support  Referral From Nurse  Spiritual Encounters  Spiritual Needs Brochure

## 2017-08-23 LAB — BASIC METABOLIC PANEL
Anion gap: 9 (ref 5–15)
BUN: 17 mg/dL (ref 6–20)
CALCIUM: 8.3 mg/dL — AB (ref 8.9–10.3)
CHLORIDE: 103 mmol/L (ref 101–111)
CO2: 24 mmol/L (ref 22–32)
CREATININE: 0.83 mg/dL (ref 0.44–1.00)
GFR calc Af Amer: >60 mL/min (ref >60)
GFR calc non Af Amer: >60 mL/min (ref >60)
Glucose, Bld: 127 mg/dL — ABNORMAL HIGH (ref 65–99)
Potassium: 3.8 mmol/L (ref 3.5–5.1)
Sodium: 136 mmol/L (ref 135–145)

## 2017-08-23 LAB — HEMOGLOBIN AND HEMATOCRIT, BLOOD
HCT: 39.9 % (ref 35.0–47.0)
HEMOGLOBIN: 13.6 g/dL (ref 12.0–16.0)

## 2017-08-23 MED ORDER — OXYCODONE-ACETAMINOPHEN 5-325 MG PO TABS: 1.0000 | ORAL_TABLET | ORAL | 0 refills | Status: AC | PRN

## 2017-08-23 MED ORDER — DOCUSATE SODIUM 100 MG PO CAPS: 100.0000 mg | ORAL_CAPSULE | Freq: Two times a day (BID) | ORAL | 0 refills | Status: DC

## 2017-08-23 MED ORDER — MECLIZINE HCL 25 MG PO TABS: 25.0000 mg | ORAL_TABLET | Freq: Two times a day (BID) | ORAL | 3 refills | Status: AC | PRN

## 2017-08-23 NOTE — Evaluation (Addendum)
Occupational Therapy Evaluation Patient Details Name: Sonya Cameron MRN: [id number] DOB: 09/02/1934 Today's Date: 08/23/2017    History of Present Illness 83yo female pt underwent elective R reverse TSR without reported post-op complications. PMH includes COPD, OA, hypothyroidims, and HTN. PT evaluation performed on POD#0. OT evaluation performed on POD#1.   Clinical Impression   Patient was seen for an OT evaluation this date. Pt lives with her daughter. Prior to surgery, pt was active and independent, enjoyed gardening. Pt will have family to assist at home while recovering. Pt has orders for RUE to be immobilized and will be NWBing per MD. Patient presents with impaired strength/ROM, pain, and balance. These impairments result in a decreased ability to perform self care tasks requiring min assist for bathing and dressing, sling mgt, and CGA for mobility with SPC. Pt instructed in ice mgt, sling mgt, elbow/wrist/hand ROM exercises for RUE, RUE precautions, adaptive strategies and AE for bathing/dressing/toileting/grooming, positioning and considerations for sleep, and home/routines modifications to maximize falls prevention, safety, and independence. Handout provided. OT adjusted sling to improve comfort, optimize positioning, and to maximize skin integrity/safety. Pt verbalized understanding of all education/training provided. Pt will benefit from skilled OT services to address these limitations and improve independence in daily tasks. Do not anticipate any follow up OT needs upon discharge. Follow up therapy as arranged by surgeon for shoulder rehab.      Follow Up Recommendations  No OT follow up;Follow surgeon's recommendation for DC plan and follow-up therapies    Equipment Recommendations  Tub/shower bench    Recommendations for Other Services       Precautions / Restrictions Precautions Precautions: Shoulder Type of Shoulder Precautions: No AROM of shoulder, no combined  add/IR Shoulder Interventions: Shoulder sling/immobilizer Precaution Booklet Issued: Yes (comment) Required Braces or Orthoses: Sling Restrictions Weight Bearing Restrictions: Yes RUE Weight Bearing: Non weight bearing      Mobility Bed Mobility Overal bed mobility: Modified Independent             General bed mobility comments: Received and left upright in recliner. Modified independent yesterday for bed mobility  Transfers Overall transfer level: Needs assistance Equipment used: Straight cane Transfers: Sit to/from Stand Sit to Stand: Min guard         General transfer comment: Pt able to stand without external assist from therapist. She is steady in standing without assistive device. Increase in time required. She continues to complain of some mild lightheadedness in standing but vitals have been taken multiple times without any issue.    Balance Overall balance assessment: Needs assistance Sitting-balance support: No upper extremity supported Sitting balance-Leahy Scale: Good     Standing balance support: No upper extremity supported Standing balance-Leahy Scale: Fair                             ADL either performed or assessed with clinical judgement   ADL Overall ADL's : Needs assistance/impaired Eating/Feeding: Modified independent Eating/Feeding Details (indicate cue type and reason): using non-dom hand Grooming: Minimal assistance;With caregiver independent assisting Grooming Details (indicate cue type and reason): using non-dom hand Upper Body Bathing: Sitting;Minimal assistance;With caregiver independent assisting   Lower Body Bathing: Minimal assistance;Sit to/from stand;With caregiver independent assisting   Upper Body Dressing : Sitting;Minimal assistance;With caregiver independent assisting Upper Body Dressing Details (indicate cue type and reason): pt educated in hemi techniques for UB dressing to maximize independence and safety Lower  Body Dressing: Sit to/from stand;Minimal assistance;With  caregiver independent assisting   Toilet Transfer: Min guard;Ambulation;BSC Toilet Transfer Details (indicate cue type and reason): SPC in L hand Toileting- Clothing Manipulation and Hygiene: Sitting/lateral lean;Independent       Functional mobility during ADLs: Min guard;Cane       Vision Baseline Vision/History: Wears glasses Wears Glasses: At all times Patient Visual Report: No change from baseline       Perception     Praxis      Pertinent Vitals/Pain Pain Assessment: 0-10 Pain Score: 5  Pain Location: R shoulder pain when moving Pain Descriptors / Indicators: Aching Pain Intervention(s): Limited activity within patient's tolerance;Monitored during session;Premedicated before session;Ice applied     Hand Dominance Right   Extremity/Trunk Assessment Upper Extremity Assessment Upper Extremity Assessment: RUE deficits/detail(LUE WFL) RUE Deficits / Details: R grip strong, elbow/hand/wrist ROM WFL, sensation intact   Lower Extremity Assessment Lower Extremity Assessment: Overall WFL for tasks assessed   Cervical / Trunk Assessment Cervical / Trunk Assessment: Normal   Communication Communication Communication: No difficulties   Cognition Arousal/Alertness: Awake/alert Behavior During Therapy: WFL for tasks assessed/performed Overall Cognitive Status: Within Functional Limits for tasks assessed                                     General Comments       Exercises Other Exercises: Pt educated in sling mgt, adaptive strategies for grooming, bathing, and dressing, AE, and elbow/wrist/hand exercises. Pt verbalized understanding. Handout provided.   Shoulder Instructions Shoulder Instructions Donning/doffing sling/immobilizer: Supervision/safety Correct positioning of sling/immobilizer: Modified independent ROM for elbow, wrist and digits of operated UE: Independent Sling wearing schedule  (on at all times/off for ADL's): DISH expects to be discharged to:: Private residence Living Arrangements: Children(Daughter) Available Help at Discharge: Family Type of Home: House Home Access: Level entry     Home Layout: Multi-level;Able to live on main level with bedroom/bathroom     Bathroom Shower/Tub: Teacher, early years/pre: Standard     Home Equipment: Environmental consultant - 2 wheels;Cane - single point;Bedside commode          Prior Functioning/Environment Level of Independence: Needs assistance  Gait / Transfers Assistance Needed: Modified independent ambulation with single point cane in RUE prior to surgery. 2 falls in the last 12 months ADL's / Homemaking Assistance Needed: Mostly independent with ADLs but requires intermittent assist from daughter washing her back. Requires assist with IADLs            OT Problem List: Decreased strength;Decreased knowledge of use of DME or AE;Decreased range of motion;Impaired UE functional use;Pain;Impaired balance (sitting and/or standing)      OT Treatment/Interventions: Self-care/ADL training;Therapeutic exercise;Therapeutic activities;DME and/or AE instruction;Patient/family education;Balance training    OT Goals(Current goals can be found in the care plan section) Acute Rehab OT Goals Patient Stated Goal: Return to prior level of function at home OT Goal Formulation: With patient Time For Goal Achievement: 09/06/17 Potential to Achieve Goals: Good ADL Goals Pt Will Perform Upper Body Dressing: with modified independence;sitting(using learned hemi technique, maintaining shlder precautions) Additional ADL Goal #1: Pt will doff/don sling with modified independence while maintaining shoulder precautions.  OT Frequency: Min 1X/week   Barriers to D/C:            Co-evaluation              AM-PAC PT "6 Clicks" Daily Activity  Outcome Measure Help from another person eating  meals?: None Help from another person taking care of personal grooming?: None Help from another person toileting, which includes using toliet, bedpan, or urinal?: A Little Help from another person bathing (including washing, rinsing, drying)?: A Little Help from another person to put on and taking off regular upper body clothing?: A Little Help from another person to put on and taking off regular lower body clothing?: A Little 6 Click Score: 20   End of Session Equipment Utilized During Treatment: Gait belt;Other (comment)(SPC)  Activity Tolerance: Patient tolerated treatment well Patient left: in chair;with call bell/phone within reach;with chair alarm set;Other (comment)(sling in place, PT in room for session)  OT Visit Diagnosis: Other abnormalities of gait and mobility (R26.89);Pain Pain - Right/Left: Right Pain - part of body: Shoulder                Time: 4142-3953 OT Time Calculation (min): 31 min Charges:  OT General Charges $OT Visit: 1 Visit OT Evaluation $OT Eval Low Complexity: 1 Low OT Treatments $Self Care/Home Management : 23-37 mins  Jeni Salles, MPH, MS, OTR/L ascom [phone] 08/23/17, 10:02 AM

## 2017-08-23 NOTE — Progress Notes (Signed)
VSS. Pt in no acute distress. RN explained discharge instructions to pt and pt verbalized understanding. IV removed. Pt wheeled to medical mall entrance and assisted into friends vehicle by RN.

## 2017-08-23 NOTE — Progress Notes (Signed)
Physical Therapy Treatment Patient Details Name: Sonya Cameron MRN: [id number] DOB: 08-02-1934 Today's Date: 08/23/2017    History of Present Illness 83yo female pt underwent elective R reverse TSR without reported post-op complications. PMH includes COPD, OA, hypothyroidims, and HTN. PT evaluation performed on POD#0. OT evaluation performed on POD#1.    PT Comments    Pt makes excellent progress with therapy during PM session. She is able to stand without external assist from therapist and is steady in standing without assistive device. She reports less "dizziness" this afternoon but still states she feels a little unsteady. Pt ambulates to rehab gym and then around RN station with single point cane. Gait speed is slightly increased from AM session and pt is more stable this afternoon. She complains of still feeling slightly unsteady but close to her baseline. VSS throughout ambulation without signs of respiratory distress or DOE. Pt able to safely ascend/descend 4 steps with step-to pattern and L rail. No LOB or unsteadiness noted and confidence improved from AM session. Pt taken through PROM R shoulder and AROM R elbow/wrist/hand exercises. She has met all PT goals for discharge and is safe to return home with support from daughter when medically appropriate. Pt will benefit from PT services to address deficits in strength, balance, and mobility in order to return to full function at home.    Follow Up Recommendations  Outpatient PT     Equipment Recommendations  None recommended by PT;Other (comment)(May need a tub bench to assist shower transfers)    Recommendations for Other Services       Precautions / Restrictions Precautions Precautions: Shoulder Type of Shoulder Precautions: No AROM of shoulder, no combined add/IR Shoulder Interventions: Shoulder sling/immobilizer Precaution Booklet Issued: Yes (comment) Required Braces or Orthoses: Sling Restrictions Weight Bearing  Restrictions: Yes RUE Weight Bearing: Non weight bearing    Mobility  Bed Mobility Overal bed mobility: Modified Independent             General bed mobility comments: Pt able to move from sitting to supine without assist from therapist. Minimal use of bed rail  Transfers Overall transfer level: Needs assistance Equipment used: Straight cane Transfers: Sit to/from Stand Sit to Stand: Min guard         General transfer comment: Pt able to stand without external assist from therapist. She is steady in standing without assistive device. She reports less "dizziness" this afternoon but still states she feels a little unsteady  Ambulation/Gait Ambulation/Gait assistance: Min guard Gait Distance (Feet): 250 Feet Assistive device: Straight cane       General Gait Details: Pt ambulates to rehab gym and then around RN station with single point cane. Gait speed is slightly increased and pt is more stable this afternoon. She complains of still feeling slightly unsteady but close to her baseline. VSS throughout ambulation distance   Stairs Stairs: Yes Stairs assistance: Min guard Stair Management: One rail Left Number of Stairs: 4 General stair comments: Pt able to safely ascend/descend 4 steps with step-to pattern and L rail during ascend. No LOB or unsteadiness noted and confidence improved from AM session   Wheelchair Mobility    Modified Rankin (Stroke Patients Only)       Balance Overall balance assessment: Needs assistance Sitting-balance support: No upper extremity supported Sitting balance-Leahy Scale: Good     Standing balance support: No upper extremity supported Standing balance-Leahy Scale: Fair  Cognition Arousal/Alertness: Awake/alert Behavior During Therapy: WFL for tasks assessed/performed Overall Cognitive Status: Within Functional Limits for tasks assessed                                         Exercises Shoulder Exercises Shoulder Flexion: PROM;Right;10 reps(To 90 degrees, no pain) Shoulder External Rotation: PROM;Right;10 reps(To neutral, no pain) Elbow Flexion: AROM;Right;10 reps Elbow Extension: AROM;Right;10 reps Wrist Flexion: AROM;Right;10 reps Wrist Extension: AROM;Right;10 reps Digit Composite Flexion: Strengthening;Right;10 reps;Other (comment)(squeezing a towel) ROM for elbow, wrist and digits of operated UE: Independent Sling wearing schedule (on at all times/off for ADL's): Independent    General Comments        Pertinent Vitals/Pain Pain Assessment: 0-10 Pain Score: 5  Pain Location: R shoulder pain at rest Pain Descriptors / Indicators: Aching Pain Intervention(s): Monitored during session    Home Living                      Prior Function            PT Goals (current goals can now be found in the care plan section) Acute Rehab PT Goals Patient Stated Goal: Return to prior level of function at home PT Goal Formulation: With patient Time For Goal Achievement: 09/05/17 Potential to Achieve Goals: Good Progress towards PT goals: Progressing toward goals    Frequency    BID      PT Plan Current plan remains appropriate    Co-evaluation              AM-PAC PT "6 Clicks" Daily Activity  Outcome Measure  Difficulty turning over in bed (including adjusting bedclothes, sheets and blankets)?: A Little Difficulty moving from lying on back to sitting on the side of the bed? : A Little Difficulty sitting down on and standing up from a chair with arms (e.g., wheelchair, bedside commode, etc,.)?: A Little Help needed moving to and from a bed to chair (including a wheelchair)?: A Little Help needed walking in hospital room?: A Little Help needed climbing 3-5 steps with a railing? : A Little 6 Click Score: 18    End of Session Equipment Utilized During Treatment: Gait belt Activity Tolerance: Patient tolerated treatment  well Patient left: with call bell/phone within reach;in bed;with bed alarm set   PT Visit Diagnosis: Unsteadiness on feet (R26.81);History of falling (Z91.81);Pain Pain - Right/Left: Right Pain - part of body: Shoulder     Time: 5320-2334 PT Time Calculation (min) (ACUTE ONLY): 24 min  Charges:  $Gait Training: 8-22 mins $Therapeutic Exercise: 8-22 mins                    G Codes:       Sonya Cameron PT, DPT     Sonya Cameron 08/23/2017, 4:27 PM

## 2017-08-23 NOTE — Discharge Instructions (Signed)
Wear sling at all times, including sleep.  You will need to use the sling for a total of 4 weeks following surgery.  Do not try and lift your arm up or away from your body for any reason.   Keep the dressing dry.  You may remove bandage in 3 days.  You may place Band-Aids over top of the incision.  May shower once dressing is removed in 3 days.  Remove sling carefully only for showers, leaving arm down by your side while in the shower.  +++ Make sure to take some pain medication this evening before you fall asleep, in preparation for the nerve block wearing off in the middle of the night.  If the the pain medication causes itching, or is too strong, try taking a single tablet at a time, or combining with Benadryl.  You may be most comfortable sleeping in a recliner.  If you do sleep in near bed, placed pillows behind the shoulder that have the operation to support it.

## 2017-08-23 NOTE — Progress Notes (Signed)
  Subjective:  Patient reports pain as moderate.    Objective:   VITALS:   Vitals:   08/22/17 1520 08/22/17 1621 08/22/17 2341 08/23/17 0506  BP: 117/73 102/74 114/73 118/74  Pulse: 71 70 68 70  Resp:   18 16  Temp: 97.6 F (36.4 C) 97.6 F (36.4 C) 97.7 F (36.5 C) 97.7 F (36.5 C)  TempSrc:    Oral  SpO2: 93% 94% 95% 95%  Weight:      Height:        PHYSICAL EXAM:  Sensation intact distally Intact pulses distally No cellulitis present Compartment soft  LABS  Results for orders placed or performed during the hospital encounter of 08/22/17 (from the past 24 hour(s))  Hemoglobin and hematocrit, blood     Status: None   Collection Time: 08/23/17  4:14 AM  Result Value Ref Range   Hemoglobin 13.6 12.0 - 16.0 g/dL   HCT 39.9 35.0 - 20.9 %  Basic metabolic panel     Status: Abnormal   Collection Time: 08/23/17  4:14 AM  Result Value Ref Range   Sodium 136 135 - 145 mmol/L   Potassium 3.8 3.5 - 5.1 mmol/L   Chloride 103 101 - 111 mmol/L   CO2 24 22 - 32 mmol/L   Glucose, Bld 127 (H) 65 - 99 mg/dL   BUN 17 6 - 20 mg/dL   Creatinine, Ser 0.83 0.44 - 1.00 mg/dL   Calcium 8.3 (L) 8.9 - 10.3 mg/dL   GFR calc non Af Amer >60 >60 mL/min   GFR calc Af Amer >60 >60 mL/min   Anion gap 9 5 - 15    Dg Shoulder Right Port  Result Date: 08/22/2017 CLINICAL DATA:  Postop shoulder arthroplasty. EXAM: PORTABLE RIGHT SHOULDER COMPARISON:  CT 07/20/2017. FINDINGS: 1046 hours. Two views demonstrate interval reverse right shoulder arthroplasty. The hardware appears well positioned. No definite acute fracture or dislocation. Ill-defined ossific density posterior to the proximal humerus on the lateral view is probably due to residual osteophytes. There are moderate acromioclavicular degenerative changes. A small amount of soft tissue emphysema is present. IMPRESSION: No demonstrated complication following right shoulder reverse arthroplasty. Electronically Signed   By: Richardean Sale M.D.    On: 08/22/2017 11:15    Assessment/Plan: 1 Day Post-Op   Active Problems:   Status post reverse arthroplasty of right shoulder   Advance diet Up with therapy D/C IV fluids  If she clears PT and OT and her pain is well controlled she may be discharged to home and follow-up with me as already scheduled.    Lovell Sheehan , MD 08/23/2017, 8:43 AM

## 2017-08-23 NOTE — Discharge Summary (Addendum)
Physician Discharge Summary  Patient ID: Sonya Cameron MRN: [id number] DOB/AGE: 81-Sep-1936 81 y.o.  Admit date: 08/22/2017 Discharge date: 08/23/2017  Admission Diagnoses:  localized primary osteoarthritis right shoulder <principal problem not specified>  Discharge Diagnoses:  localized primary osteoarthritis right shoulder Active Problems:   Status post reverse arthroplasty of right shoulder   Past Medical History:  Diagnosis Date  . Arthritis   . Cancer ([place])    skin  . Complication of anesthesia   . COPD (chronic obstructive pulmonary disease) ([place])   . Hypertension    no medications  . Hypothyroidism   . PONV (postoperative nausea and vomiting)    severe x 1  . Scoliosis    lumbar, missing disc    Surgeries: Procedure(s): REVERSE SHOULDER ARTHROPLASTY on 08/22/2017   Consultants (if any):   Discharged Condition: Improved  Hospital Course: Sonya Cameron is an 83 y.o. female who was admitted 08/22/2017 with a diagnosis of  localized primary osteoarthritis right shoulder <principal problem not specified> and went to the operating room on 08/22/2017 and underwent the above named procedures.    She was given perioperative antibiotics:  Anti-infectives (From admission, onward)   Start     Dose/Rate Route Frequency Ordered Stop   08/22/17 1400  ceFAZolin (ANCEF) IVPB 2g/100 mL premix     2 g 200 mL/hr over 30 Minutes Intravenous Every 6 hours 08/22/17 1140 08/23/17 0154   08/22/17 0855  50,000 units bacitracin in 0.9% normal saline 250 mL irrigation  Status:  Discontinued       As needed 08/22/17 0856 08/22/17 1032   08/22/17 0600  ceFAZolin (ANCEF) IVPB 2g/100 mL premix     2 g 200 mL/hr over 30 Minutes Intravenous On call to O.R. 08/21/17 2141 08/22/17 6578    .  She was given sequential compression devices, early ambulation, and ECASA for DVT prophylaxis.  She benefited maximally from the hospital stay and there were no complications.    Recent vital signs:   Vitals:   08/23/17 0857 08/23/17 1138  BP: 116/81 (!) 101/58  Pulse: 75 76  Resp: 18 20  Temp: 98 F (36.7 C) 97.7 F (36.5 C)  SpO2: 92% 94%    Recent laboratory studies:  Lab Results  Component Value Date   HGB 13.6 08/23/2017   HGB 15.6 08/10/2017   Lab Results  Component Value Date   WBC 5.4 08/10/2017   PLT 196 08/10/2017   Lab Results  Component Value Date   INR 0.88 08/10/2017   Lab Results  Component Value Date   NA 136 08/23/2017   K 3.8 08/23/2017   CL 103 08/23/2017   CO2 24 08/23/2017   BUN 17 08/23/2017   CREATININE 0.83 08/23/2017   GLUCOSE 127 (H) 08/23/2017    Discharge Medications:   Allergies as of 08/23/2017      Reactions   Tramadol Itching   Sulfamethoxazole-trimethoprim Itching   PT STATES THAT IT "MESSES WITH HER MIND"   Hydrocodone-acetaminophen Other (See Comments)   Headache      Medication List    STOP taking these medications   ibuprofen 200 MG tablet Commonly known as:  ADVIL,MOTRIN     TAKE these medications   albuterol 108 (90 Base) MCG/ACT inhaler Commonly known as:  PROVENTIL HFA;VENTOLIN HFA Inhale 2 puffs into the lungs every 6 (six) hours as needed for wheezing or shortness of breath.   aspirin EC 81 MG tablet Take 81 mg by mouth daily.   betamethasone dipropionate 0.05 %  cream Commonly known as:  DIPROLENE Apply 1 application topically daily as needed (itching).   clindamycin 150 MG capsule Commonly known as:  CLEOCIN Take 600 mg by mouth See admin instructions. Take 600 mg by mouth 1 hour prior to dental procedures   docusate sodium 100 MG capsule Commonly known as:  COLACE Take 1 capsule (100 mg total) by mouth 2 (two) times daily.   levothyroxine 50 MCG tablet Commonly known as:  SYNTHROID, LEVOTHROID Take 50 mcg by mouth daily before breakfast.   meclizine 25 MG tablet Commonly known as:  ANTIVERT Take 1 tablet (25 mg total) by mouth 2 (two) times daily as needed for dizziness. What changed:   when to take this   meloxicam 7.5 MG tablet Commonly known as:  MOBIC Take 7.5 mg by mouth 2 (two) times daily.   oxyCODONE-acetaminophen 5-325 MG tablet Commonly known as:  PERCOCET Take 1 tablet by mouth every 4 (four) hours as needed for severe pain.   oxymetazoline 0.05 % nasal spray Commonly known as:  AFRIN Place 1-2 sprays into both nostrils as needed for congestion.       Diagnostic Studies: Dg Shoulder Right Port  Result Date: 08/22/2017 CLINICAL DATA:  Postop shoulder arthroplasty. EXAM: PORTABLE RIGHT SHOULDER COMPARISON:  CT 07/20/2017. FINDINGS: 1046 hours. Two views demonstrate interval reverse right shoulder arthroplasty. The hardware appears well positioned. No definite acute fracture or dislocation. Ill-defined ossific density posterior to the proximal humerus on the lateral view is probably due to residual osteophytes. There are moderate acromioclavicular degenerative changes. A small amount of soft tissue emphysema is present. IMPRESSION: No demonstrated complication following right shoulder reverse arthroplasty. Electronically Signed   By: Richardean Sale M.D.   On: 08/22/2017 11:15    Disposition: Discharge disposition: 01-Home or Self Care            Signed: Lovell Sheehan ,MD 08/23/2017, 3:32 PM

## 2017-08-23 NOTE — Progress Notes (Signed)
Physical Therapy Treatment Patient Details Name: Sonya Cameron MRN: [id number] DOB: 05-Jul-1934 Today's Date: 08/23/2017    History of Present Illness 83yo female pt underwent elective R reverse TSR without reported post-op complications. PMH includes COPD, OA, hypothyroidims, and HTN. PT evaluation performed on POD#0. OT evaluation performed on POD#1.    PT Comments    Pt demonstrates good progress with therapy on this date. She is able to stand without external assist from therapist. She is steady in standing without assistive device. Increase in time required to come to standing. She continues to complain of some mild lightheadedness in standing but vitals have been taken multiple times without any issue. Pt is able to ambulate from her room to the rehab gym and then to the RN station and back. Gait speed is slow with some mild instability noted. SaO2 drops to 86% during ambulation but rebounds to 96% with standing rest break. Pt reports mild DOE with ambulation. She reports bilateral LE weakness and lack of confidence with ambulation. Pt able to safely ascend/descend 4 steps with step-to pattern and L rail during ascend. No LOB or unsteadiness noted however she does lack confidence. Pt would benefit from second PT session today. She is in agreement and requests another PT session prior to discharge. Pt will benefit from PT services to address deficits in strength, balance, and mobility in order to return to full function at home.     Follow Up Recommendations  Outpatient PT     Equipment Recommendations  None recommended by PT;Other (comment)(May need a tub bench to assist shower transfers)    Recommendations for Other Services       Precautions / Restrictions Precautions Precautions: Shoulder Type of Shoulder Precautions: No AROM of shoulder, no combined add/IR Shoulder Interventions: Shoulder sling/immobilizer Precaution Booklet Issued: Yes (comment) Required Braces or Orthoses:  Sling Restrictions Weight Bearing Restrictions: Yes RUE Weight Bearing: Non weight bearing    Mobility  Bed Mobility               General bed mobility comments: Received and left upright in recliner. Modified independent yesterday for bed mobility  Transfers Overall transfer level: Needs assistance Equipment used: Straight cane Transfers: Sit to/from Stand Sit to Stand: Min guard         General transfer comment: Pt able to stand without external assist from therapist. She is steady in standing without assistive device. Increase in time required. She continues to complain of some mild lightheadedness in standing but vitals have been taken multiple times without any issue.  Ambulation/Gait Ambulation/Gait assistance: Min guard Gait Distance (Feet): 150 Feet Assistive device: Straight cane       General Gait Details: Pt able to ambulate from her room to the rehab gym and then to the RN station and back. Gait speed is slow with some mild instability noted. SaO2 drops to 86% during ambulation but rebounds to 96% with standing rest break. Pt reports mild DOE with ambulation. She reports bilateral LE weakness and lack of confidence with ambulation.   Stairs Stairs: Yes Stairs assistance: Min guard Stair Management: One rail Left Number of Stairs: 4 General stair comments: Pt able to safely ascend/descend 4 steps with step-to pattern and L rail during ascend. No LOB or unsteadiness noted however she does lack confidence.   Wheelchair Mobility    Modified Rankin (Stroke Patients Only)       Balance Overall balance assessment: Needs assistance Sitting-balance support: No upper extremity supported Sitting balance-Leahy Scale: Good  Standing balance support: No upper extremity supported Standing balance-Leahy Scale: Fair                              Cognition Arousal/Alertness: Awake/alert Behavior During Therapy: WFL for tasks  assessed/performed Overall Cognitive Status: Within Functional Limits for tasks assessed                                        Exercises Shoulder Exercises Elbow Flexion: AROM;Right;10 reps Elbow Extension: AROM;Right;10 reps Wrist Flexion: AROM;Right;10 reps Wrist Extension: AROM;Right;10 reps Digit Composite Flexion: Strengthening;Right;10 reps;Other (comment)(squeezing a towel) Neck Lateral Flexion - Left: AROM;Seated(L flexion stretch for R upper trap) Other Exercises Other Exercises: R forearm pronation/supination x 10, seated scapular retraction 3s hold x 10 Donning/doffing sling/immobilizer: Supervision/safety Correct positioning of sling/immobilizer: Modified independent ROM for elbow, wrist and digits of operated UE: Independent Sling wearing schedule (on at all times/off for ADL's): Independent    General Comments        Pertinent Vitals/Pain Pain Location: R shoulder pain when moving Pain Intervention(s): Monitored during session    Home Living                      Prior Function            PT Goals (current goals can now be found in the care plan section) Acute Rehab PT Goals Patient Stated Goal: Return to prior level of function at home PT Goal Formulation: With patient Time For Goal Achievement: 09/05/17 Potential to Achieve Goals: Good Progress towards PT goals: Progressing toward goals    Frequency    BID      PT Plan Current plan remains appropriate    Co-evaluation              AM-PAC PT "6 Clicks" Daily Activity  Outcome Measure  Difficulty turning over in bed (including adjusting bedclothes, sheets and blankets)?: A Little Difficulty moving from lying on back to sitting on the side of the bed? : A Little Difficulty sitting down on and standing up from a chair with arms (e.g., wheelchair, bedside commode, etc,.)?: A Little Help needed moving to and from a bed to chair (including a wheelchair)?: A  Little Help needed walking in hospital room?: A Little Help needed climbing 3-5 steps with a railing? : A Little 6 Click Score: 18    End of Session Equipment Utilized During Treatment: Gait belt Activity Tolerance: Patient tolerated treatment well Patient left: in chair;with call bell/phone within reach;with chair alarm set Nurse Communication: Mobility status PT Visit Diagnosis: Unsteadiness on feet (R26.81);History of falling (Z91.81);Pain Pain - Right/Left: Right Pain - part of body: Shoulder     Time: 5188-4166 PT Time Calculation (min) (ACUTE ONLY): 25 min  Charges:  $Gait Training: 8-22 mins $Therapeutic Exercise: 8-22 mins                    G Codes:       Lyndel Safe Rani Idler PT, DPT     Dallin Mccorkel 08/23/2017, 9:45 AM

## 2017-08-24 ENCOUNTER — Encounter: Payer: Self-pay | Admitting: Orthopedic Surgery

## 2017-08-24 LAB — SURGICAL PATHOLOGY

## 2017-08-24 NOTE — Anesthesia Postprocedure Evaluation (Signed)
Anesthesia Post Note  Patient: Sonya Cameron  Procedure(s) Performed: REVERSE SHOULDER ARTHROPLASTY (Right )  Patient location during evaluation: PACU Anesthesia Type: General Level of consciousness: awake and alert and oriented Pain management: pain level controlled Vital Signs Assessment: post-procedure vital signs reviewed and stable Respiratory status: spontaneous breathing Cardiovascular status: blood pressure returned to baseline Anesthetic complications: no     Last Vitals:  Vitals:   08/23/17 1610 08/23/17 1655  BP: 129/78 (!) 147/88  Pulse: 87 (!) 102  Resp: 18 18  Temp: 36.8 C 36.7 C  SpO2: 94% 93%    Last Pain:  Vitals:   08/23/17 1655  TempSrc: Oral  PainSc:                  Mikael Skoda

## 2017-08-26 ENCOUNTER — Telehealth: Payer: Self-pay

## 2017-08-26 NOTE — Telephone Encounter (Signed)
EMMI Follow-up: Noted on the report patient was unsure who to contact if there was a change in her condition.  She said she had some periods of  dizziness in the hospital and now at home.  Suggested she contact her PCP, Dr. Alean Rinne if this continues.  Explained there would be a 2nd automated call with a different series of questions and to let us know if she had any other concerns at that time.

## 2020-02-01 IMAGING — CT CT SHOULDER*R* W/O CM
3 of 4 series · 11 of 35 positions shown, 13 images · non-contrast
Comparison: None.

CLINICAL DATA: Right shoulder osteoarthritis. Preoperative study
for planned shoulder replacement.

EXAM:
CT OF THE UPPER RIGHT EXTREMITY WITHOUT CONTRAST
TECHNIQUE: Multidetector CT imaging of the upper right extremity was performed
according to the standard protocol.

[Series 7: ax st · axial · 0.45mm/px · z∈[-288,-77]mm · 3 of 118 slices shown, 4 images]
[im 1/118  soft-tissue]
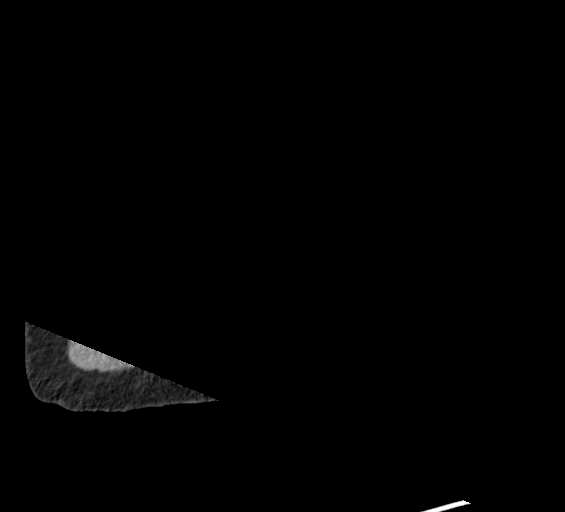
[im 1/118  bone]
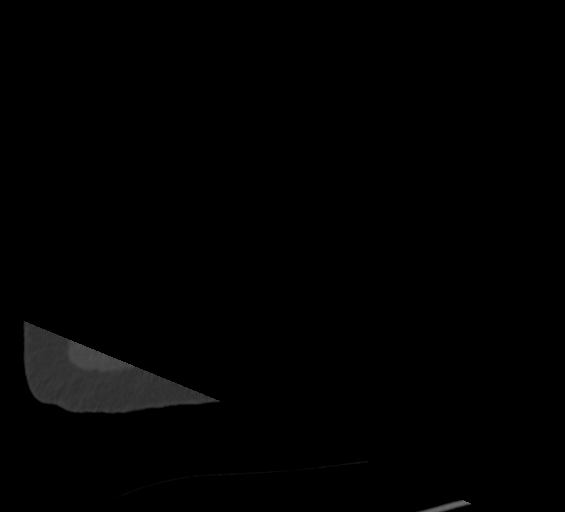
[im 59/118  bone]
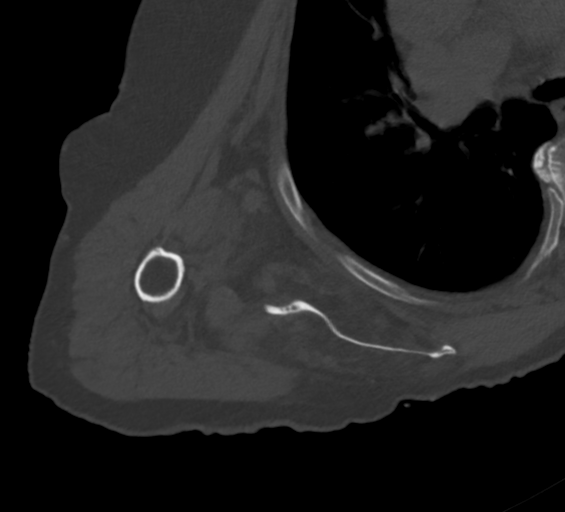
[im 118/118  bone]
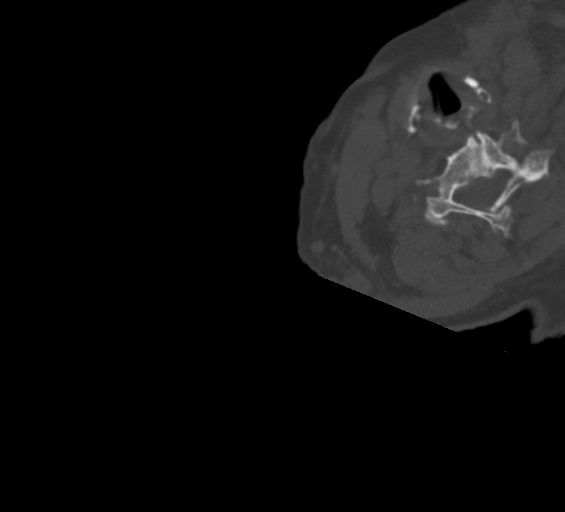

[Series 8: cor st · coronal · 0.46mm/px · 3 of 109 slices shown]
[im 36/109  bone]
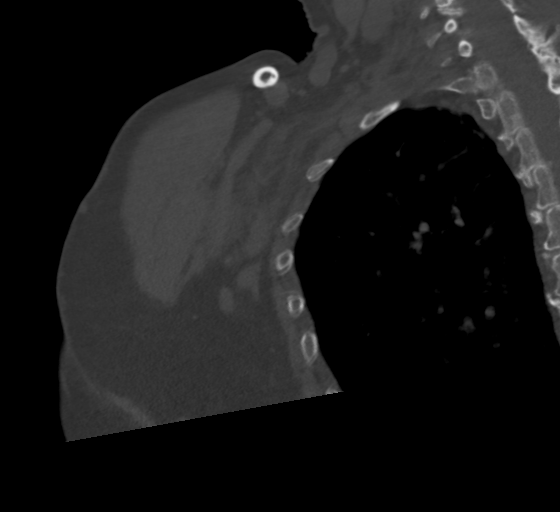
[im 48/109  bone]
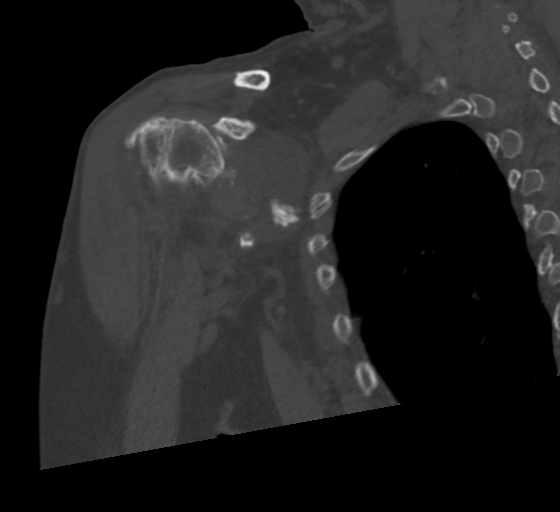
[im 61/109  bone]
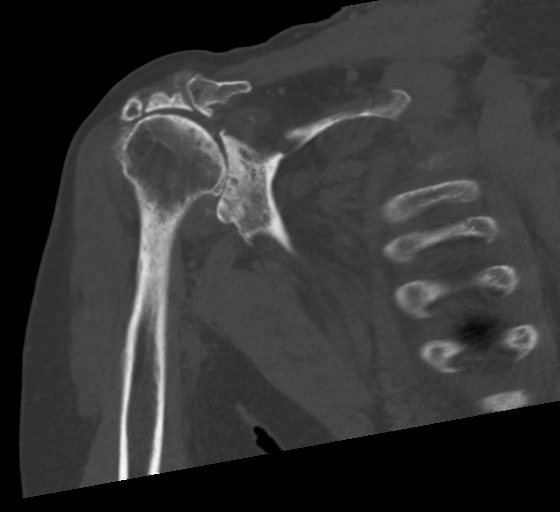

[Series 9: sag st · sagittal · 0.46mm/px · 5 of 147 slices shown, 6 images]
[im 49/147  bone]
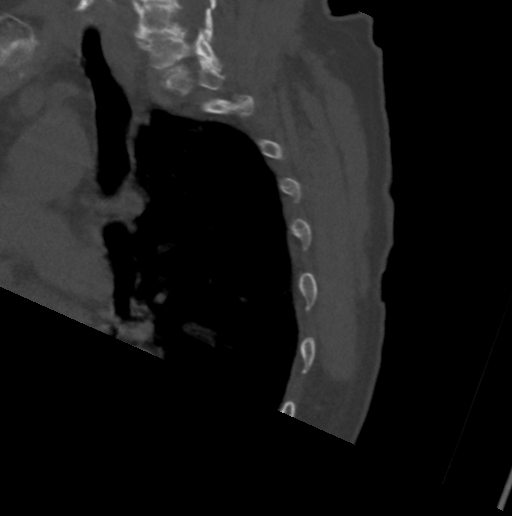
[im 61/147  bone]
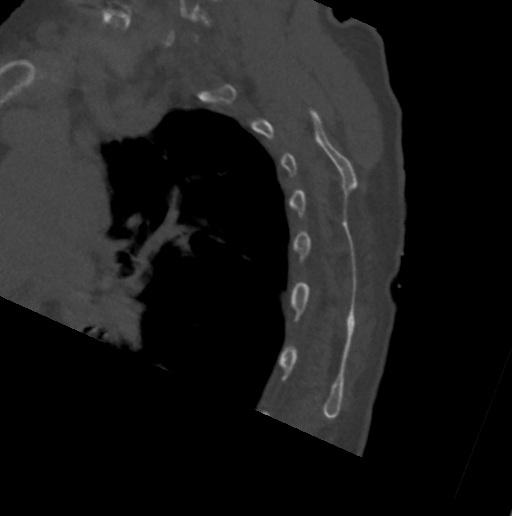
[im 74/147  soft-tissue]
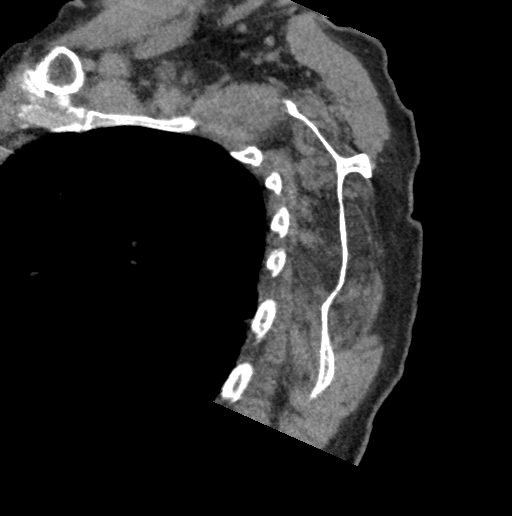
[im 74/147  bone]
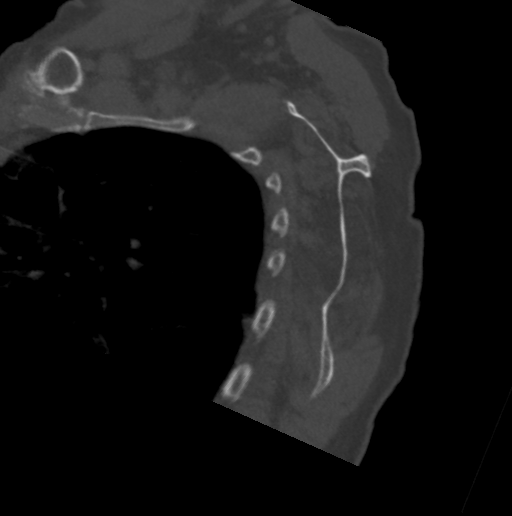
[im 86/147  bone]
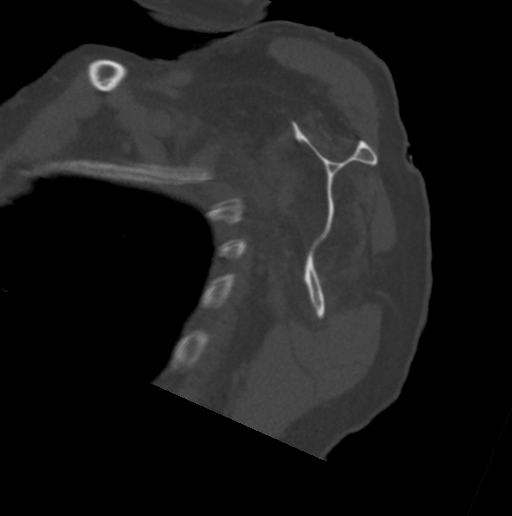
[im 98/147  bone]
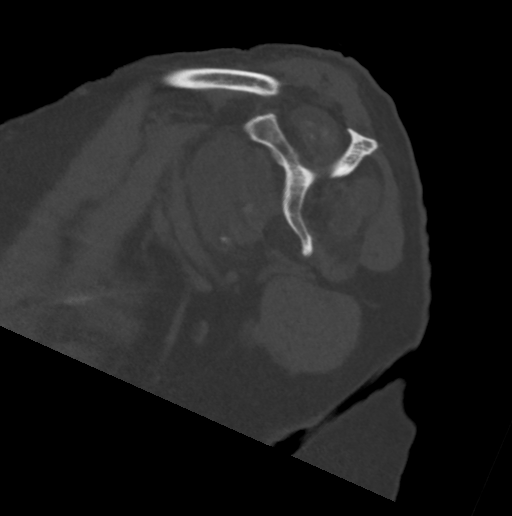

[11 of 35 positions shown; findings below may reference images not displayed]

FINDINGS: Bones/Joint/Cartilage

No fracture or dislocation. Normal alignment. Large glenohumeral
joint effusion.

Severe osteoarthritis of the glenohumeral joint with severe joint
space narrowing, bone-on-bone appearance, subchondral sclerosis,
subchondral cystic changes and marginal osteophytosis. Multiple
large loose bodies.

Severe arthropathy of the acromioclavicular joint. Type I acromion.
Narrowing of the acromiohumeral interval.

Ligaments

Ligaments are suboptimally evaluated by CT.

Muscles and Tendons
Severe atrophy of the subscapularis and infraspinatus muscles.
Moderate atrophy of the supraspinatus and teres minor muscles.
Massive chronic full-thickness rotator cuff tear.

Soft tissue
No fluid collection or hematoma.  No soft tissue mass.
IMPRESSION: 1. Severe glenohumeral osteoarthritis with large joint effusion and
multiple large loose bodies.
2. Severe acromioclavicular osteoarthritis.
3. Massive chronic full-thickness rotator cuff tear with moderate to
severe atrophy of the rotator cuff muscles.

## 2021-07-20 HISTORY — PX: CARDIAC ELECTROPHYSIOLOGY STUDY AND ABLATION: SHX1294

## 2021-07-20 HISTORY — PX: CARDIOVERSION: SHX1299

## 2021-10-02 ENCOUNTER — Encounter: Payer: Self-pay | Admitting: Orthopedic Surgery

## 2021-10-02 ENCOUNTER — Other Ambulatory Visit: Payer: Self-pay | Admitting: Orthopedic Surgery

## 2021-10-02 DIAGNOSIS — Z01818 Encounter for other preprocedural examination: Secondary | ICD-10-CM

## 2021-10-02 NOTE — H&P (Signed)
NAME: Sonya Cameron MRN:   [id number] DOB:   Aug 18, 1934     HISTORY AND PHYSICAL  CHIEF COMPLAINT:  right hip pain  HISTORY:   Sonya Cameron a 87 y.o. female  with right  Hip Pain Patient complains of right hip pain. Onset of the symptoms was several years ago. Inciting event: known DJD. The patient reports the hip pain is worse with weight bearing. Associated symptoms: none. Aggravating symptoms include: any weight bearing. Patient has had no prior hip problems. Previous visits for this problem: yes, last seen several weeks ago by Dr. Harlow Mares . Evaluation to date: plain films, which were abnormal  osteoarthritis . Treatment to date: OTC analgesics, which have been somewhat effective, prescription analgesics, which have been somewhat effective, and home exercise program, which has been somewhat effective.    Plan for right total hip replacement  PAST MEDICAL HISTORY:   Past Medical History:  Diagnosis Date   Arthritis    Cancer ([place])    skin   Complication of anesthesia    COPD (chronic obstructive pulmonary disease) ([facility])    Hypertension    no medications   Hypothyroidism    PONV (postoperative nausea and vomiting)    severe x 1   Scoliosis    lumbar, missing disc    PAST SURGICAL HISTORY:   Past Surgical History:  Procedure Laterality Date   BUNIONECTOMY     CESAREAN SECTION     x3   ctr     x3   EYE SURGERY     FRACTURE SURGERY     JOINT REPLACEMENT     both knees   REVERSE SHOULDER ARTHROPLASTY Right 08/22/2017   Procedure: REVERSE SHOULDER ARTHROPLASTY;  Surgeon: Lovell Sheehan, MD;  Location: [facility] ORS;  Service: Orthopedics;  Laterality: Right;    MEDICATIONS:  (Not in a hospital admission)   ALLERGIES:   Allergies  Allergen Reactions   Tramadol Itching   Sulfamethoxazole-Trimethoprim Itching    PT STATES THAT IT "MESSES WITH HER MIND"    Hydrocodone-Acetaminophen Other (See Comments)    Headache     REVIEW OF SYSTEMS:   Negative except HPI  FAMILY  HISTORY:  No family history on file.  SOCIAL HISTORY:   reports that she has never smoked. She uses smokeless tobacco. She reports current alcohol use. She reports that she does not use drugs.  PHYSICAL EXAM:  General appearance: alert, cooperative, and no distress Neck: no JVD and supple, symmetrical, trachea midline Resp: clear to auscultation bilaterally GI: soft, non-tender; bowel sounds normal; no masses,  no organomegaly Extremities: extremities normal, atraumatic, no cyanosis or edema and Homans sign is negative, no sign of DVT Pulses: 2+ and symmetric Skin: Skin color, texture, turgor normal. No rashes or lesions Neurologic: Alert and oriented X 3, normal strength and tone. Normal symmetric reflexes. Normal coordination and gait    LABORATORY STUDIES: No results for input(s): "WBC", "HGB", "HCT", "PLT" in the last 72 hours.  No results for input(s): "NA", "K", "CL", "CO2", "GLUCOSE", "BUN", "CREATININE", "CALCIUM" in the last 72 hours.  STUDIES/RESULTS:  No results found.  ASSESSMENT:  End stage osteoarthritis right hip        Active Problems:   * No active hospital problems. *    PLAN:  Right Primary Total Hip   Carlynn Spry 10/02/2021. 12:23 PM

## 2021-10-21 ENCOUNTER — Encounter
Admission: RE | Admit: 2021-10-21 | Discharge: 2021-10-21 | Disposition: A | Discharge status: Home / Self Care | Payer: Medicare Other | Source: Ambulatory Visit | Attending: Orthopedic Surgery | Admitting: Orthopedic Surgery

## 2021-10-21 DIAGNOSIS — Z01812 Encounter for preprocedural laboratory examination: Secondary | ICD-10-CM | POA: Insufficient documentation

## 2021-10-21 DIAGNOSIS — Z01818 Encounter for other preprocedural examination: Secondary | ICD-10-CM

## 2021-10-21 HISTORY — DX: Unspecified malignant neoplasm of skin, unspecified: C44.90

## 2021-10-21 HISTORY — DX: Hyperlipidemia, unspecified: E78.5

## 2021-10-21 HISTORY — DX: Other forms of dyspnea: R06.09

## 2021-10-21 HISTORY — DX: Unspecified atrial fibrillation: I48.91

## 2021-10-21 HISTORY — DX: Obstructive sleep apnea (adult) (pediatric): G47.33

## 2021-10-21 HISTORY — DX: Long term (current) use of anticoagulants: Z79.01

## 2021-10-21 LAB — URINALYSIS, ROUTINE W REFLEX MICROSCOPIC
Bilirubin Urine: NEGATIVE
Glucose, UA: NEGATIVE mg/dL
Ketones, ur: NEGATIVE mg/dL
Leukocytes,Ua: NEGATIVE
Nitrite: NEGATIVE
Protein, ur: NEGATIVE mg/dL
Specific Gravity, Urine: 1.006 (ref 1.005–1.030)
pH: 8 (ref 5.0–8.0)

## 2021-10-21 LAB — BASIC METABOLIC PANEL
Anion gap: 9 (ref 5–15)
BUN: 15 mg/dL (ref 8–23)
CO2: 28 mmol/L (ref 22–32)
Calcium: 9.3 mg/dL (ref 8.9–10.3)
Chloride: 103 mmol/L (ref 98–111)
Creatinine, Ser: 0.86 mg/dL (ref 0.44–1.00)
GFR, Estimated: >60 mL/min (ref >60)
Glucose, Bld: 103 mg/dL — ABNORMAL HIGH (ref 70–99)
Potassium: 4 mmol/L (ref 3.5–5.1)
Sodium: 140 mmol/L (ref 135–145)

## 2021-10-21 LAB — CBC
HCT: 47.5 % — ABNORMAL HIGH (ref 36.0–46.0)
Hemoglobin: 15.7 g/dL — ABNORMAL HIGH (ref 12.0–15.0)
MCH: 31.4 pg (ref 26.0–34.0)
MCHC: 33.1 g/dL (ref 30.0–36.0)
MCV: 95 fL (ref 80.0–100.0)
Platelets: 180 10*3/uL (ref 150–400)
RBC: 5 MIL/uL (ref 3.87–5.11)
RDW: 12.7 % (ref 11.5–15.5)
WBC: 4.5 10*3/uL (ref 4.0–10.5)
nRBC: 0 % (ref 0.0–0.2)

## 2021-10-21 LAB — TYPE AND SCREEN
ABO/RH(D): A NEG
Antibody Screen: NEGATIVE

## 2021-10-21 LAB — SURGICAL PCR SCREEN
MRSA, PCR: NEGATIVE
Staphylococcus aureus: POSITIVE — AB

## 2021-10-21 NOTE — Patient Instructions (Addendum)
Your procedure is scheduled on: 11/03/21 Report to [place]. To find out your arrival time please call [phone] between 1PM - 3PM on 11/02/21.  Remember: Instructions that are not followed completely may result in serious medical risk, up to and including death, or upon the discretion of your surgeon and anesthesiologist your surgery may need to be rescheduled.     _X__ 1. Do not eat food or drink any liquids after midnight the night before your procedure.                 No gum chewing or hard candies.   __X__2.  On the morning of surgery brush your teeth with toothpaste and water, you                 may rinse your mouth with mouthwash if you wish.  Do not swallow any              toothpaste of mouthwash.     _X__ 3.  No Alcohol for 24 hours before or after surgery.   _X__ 4.  Do Not Smoke or use e-cigarettes For 24 Hours Prior to Your Surgery.                 Do not use any chewable tobacco products for at least 6 hours prior to                 surgery.  ____  5.  Bring all medications with you on the day of surgery if instructed.   __X__  6.  Notify your doctor if there is any change in your medical condition      (cold, fever, infections).     Do not wear jewelry, make-up, hairpins, clips or nail polish. Do not wear lotions, powders, or perfumes.  Do not shave body hair 48 hours prior to surgery. Men may shave face and neck. Do not bring valuables to the hospital.    [facility] is not responsible for any belongings or valuables.  Contacts, dentures/partials or body piercings may not be worn into surgery. Bring a case for your contacts, glasses or hearing aids, a denture cup will be supplied. Leave your suitcase in the car. After surgery it may be brought to your room. For patients admitted to the hospital, discharge time is determined by your treatment team.   Patients discharged the day of surgery will not be  allowed to drive home.   Please read over the following fact sheets that you were given:   MRSA Information, CHG soap, Incentive Spirometer  __X__ Take these medicines the morning of surgery with A SIP OF WATER:    1. levothyroxine (SYNTHROID, LEVOTHROID) 50 MCG tablet  2.   3.   4.  5.  6.  ____ Fleet Enema (as directed)   __X__ Use CHG Soap/SAGE wipes as directed  __X__ Use inhalers on the day of surgery  ____ Stop metformin/Janumet/Farxiga 2 days prior to surgery    ____ Take 1/2 of usual insulin dose the night before surgery. No insulin the morning          of surgery.   ____ Stop Blood Thinners Coumadin/Plavix/Xarelto/Pleta/Pradaxa/Eliquis/Effient/Aspirin  on   Or contact your Surgeon, Cardiologist or Medical Doctor regarding  ability to stop your blood thinners  __X__ Stop Anti-inflammatories 7 days before surgery such as Advil, Ibuprofen, Motrin,  BC or Goodies Powder, Naprosyn, Naproxen, Aleve, Aspirin  __X__ Stop all herbals and supplements, fish oil or vitamins  until after surgery.    ____ Bring C-Pap to the hospital.    OUR OFFICE WILL CONTACT YOU REGARDING YOUR ABILITY TO STOP YOUR [place] HEARD FROM YOUR CARDIOLOGIST

## 2021-10-26 ENCOUNTER — Encounter: Payer: Self-pay | Admitting: Orthopedic Surgery

## 2021-10-27 ENCOUNTER — Encounter: Payer: Self-pay | Admitting: Orthopedic Surgery

## 2021-10-27 NOTE — Progress Notes (Signed)
Perioperative Services  Pre-Admission/Anesthesia Testing Clinical Review  Date: 10/27/21  Patient Demographics:  Name: Sonya Cameron DOB:   Sep 24, 1934 MRN:   [id number]  Planned Surgical Procedure(s):    Case: [id number] Date/Time: 11/03/21 1212   Procedure: TOTAL HIP ARTHROPLASTY ANTERIOR APPROACH (Right: Hip)   Anesthesia type: Spinal   Pre-op diagnosis: M16.11 Unilateral primary osteoarthritis, right hip   Location: [facility] OR ROOM 05 / [facility]   Surgeons: Lovell Sheehan, MD   NOTE: Available PAT nursing documentation and vital signs have been reviewed. Clinical nursing staff has updated patient's PMH/PSHx, current medication list, and drug allergies/intolerances to ensure comprehensive history available to assist in medical decision making as it pertains to the aforementioned surgical procedure and anticipated anesthetic course. Extensive review of available clinical information performed. Sonya Cameron PMH and PSHx updated with any diagnoses/procedures that  may have been inadvertently omitted during her intake with the pre-admission testing department's nursing staff.  Clinical Discussion:  Sonya Cameron is a 87 y.o. female who is submitted for pre-surgical anesthesia review and clearance prior to her undergoing the above procedure. Patient has never been a smoker. Pertinent PMH includes: atrial fibrillation/flutter, cardiomegaly, HTN, HLD, hypothyroidism, COPD, DOE, OSAH (unable to tolerate nocturnal PAP therapy), OA, scoliosis.  Patient is followed by cardiology Dwyane Dee, MD). She was last seen in the cardiology clinic on 09/16/2021; notes reviewed.  At the time of her clinic visit, patient denied any episodes of chest pain.  She had chronic exertional dyspnea related to her underlying COPD diagnosis.  Patient denied any PND, orthopnea, significant peripheral edema,  or presyncope/syncope.  Patient complained of significant fatigue. She was intermittently dizzy, which  was a chronic symptom for her and improved with the use of meclizine as needed.  Patient with past medical history significant for cardiovascular diagnoses.  Most recent myocardial perfusion imaging study was performed on 06/02/2017 revealing a normal left ventricular systolic function with an EF of 55%.  There was no evidence of significant stress-induced myocardial ischemia or arrhythmia; no scintigraphic evidence of scar.  Study was determined to be normal.  Last TTE was performed on 12/19/2020 revealing a normal left ventricular systolic function with an EF of >55%.  Right ventricular size and function normal.  There was severe biatrial enlargement.  There was trivial mitral, pulmonic, and tricuspid valve regurgitation.  There was no evidence of a significant transvalvular gradient to suggest stenosis.  No pulmonary hypertension; estimated PASP 32 mmHg.  Patient presented for DCCV procedure on 07/18/2021.  She received a single 200 J cardioversion.  Additionally, empiric cavotricuspid isthmus (CTI) ablation was performed restoring NSR.  When patient was last seen on 09/16/2021, refractory atrial fibrillation at a controlled rate noted.  As previously mentioned, patient with a past medical history significant for atrial fibrillation/flutter.  Rate and rhythm currently being maintained with metoprolol succinate.  She is chronically anticoagulated using standard dose apixaban; compliant with therapy with no evidence or reports of GI bleeding.  Blood pressure well controlled at***on currently prescribed ACEi (enalapril) and beta-blocker (metoprolol succinate) therapies.  Patient is not currently taking any type of lipid-lowering therapies for her HLD diagnosis or ASCVD prevention.  Patient is not diabetic.  She does have an OSAH diagnosis, however is unable to tolerate prescribed nocturnal PAP therapy.  Functional capacity limited by age and multiple medical comorbidities, however patient felt to be able to  achieve at least 4 METS of activity without angina/anginal equivalent symptoms.  No changes were made to her  medication regimen.  Patient follow-up with outpatient cardiology in 2 months or sooner if needed.  Sonya Cameron is scheduled for an elective RIGHT TOTAL HIP ARTHROPLASTY ANTERIOR APPROACH  on 11/03/2021 with Dr. Kurtis Bushman, MD.  Given patient's past medical history significant for cardiovascular diagnoses, presurgical cardiac clearance was sought by the PAT team. ***.  In review of her medication reconciliation, it is noted the patient is on daily anticoagulation therapy.  She has been instructed on recommendations for holding her apixaban dose for***days prior to her procedure with plans to restart as soon as postoperatively respectively minimized by her primary attending surgeon.  Patient is aware that her last dose of apixaban should be on***.  Patient reports previous perioperative complications with anesthesia in the past. Patient has a PMH (+) for PONV. Symptoms and history of PONV will be discussed with patient by anesthesia team on the day of her procedure. Interventions will be ordered as deemed necessary based on patient's individual care needs as determined by anesthesiologist. In review of the available records, it is noted that patient underwent a general anesthetic course here at [facility] ([facility]) in 08/2017 without documented complications.      10/21/2021   10:11 AM 10/21/2021    9:19 AM 08/23/2017    4:55 PM  Vitals with BMI  Height  '5\' 1"'$    Weight  201 lbs   BMI  38   Systolic 468 032 122  Diastolic 482 500 88  Pulse  91 102    Providers/Specialists:   NOTE: Primary physician provider listed below. Patient may have been seen by APP or partner within same practice.   PROVIDER ROLE / SPECIALTY LAST OV  Lovell Sheehan, MD Orthopedics (Surgeon) 10/02/2021  Coy Saunas, MD Primary Care Provider 09/24/2021  Dimas Alexandria, MD  Cardiology 09/16/2021   Allergies:  Tramadol, Sulfamethoxazole-trimethoprim, and Hydrocodone-acetaminophen  Current Home Medications:   No current facility-administered medications for this encounter.    albuterol (PROVENTIL HFA;VENTOLIN HFA) 108 (90 Base) MCG/ACT inhaler   apixaban (ELIQUIS) 5 MG TABS tablet   clindamycin (CLEOCIN) 150 MG capsule   enalapril (VASOTEC) 10 MG tablet   levothyroxine (SYNTHROID, LEVOTHROID) 50 MCG tablet   meclizine (ANTIVERT) 25 MG tablet   Metoprolol Succinate 25 MG CS24   History:   Past Medical History:  Diagnosis Date   Arthritis    Atrial fibrillation and flutter (HCC)    a.) CHA2DS2-VASc = 4 (age x2, sex, HTN);  b.) s/p DCCV (200 J x 1) + CTI cardiac ablation 07/20/2021; c.) rate/rhythm maintained on oral metoprolol succinate; chronically anticoagulated using apixaban   Cardiomegaly    Complication of anesthesia    a.) PONV   COPD (chronic obstructive pulmonary disease) ([place])    DOE (dyspnea on exertion)    Dry age-related macular degeneration    Fuchs' corneal dystrophy of both eyes    HLD (hyperlipidemia)    Hypertension    Hypothyroidism    Long term current use of anticoagulant    a.) apixaban   Nuclear sclerotic cataract of both eyes    OSA (obstructive sleep apnea)    a.) unable to tolerate nocturnal PAP therapy   PONV (postoperative nausea and vomiting)    Scoliosis    lumbar, missing disc   Skin cancer    Past Surgical History:  Procedure Laterality Date   BUNIONECTOMY     CARDIAC ELECTROPHYSIOLOGY STUDY AND ABLATION N/A 07/20/2021   Procedure: CARDIAC ELECTROPHYSIOLOGY STUDY  AND ABLATION; Location: [facility]; Surgeon: Dimas Alexandria, MD   CARDIOVERSION N/A 07/20/2021   Procedure: CARDIOVERSION (DCCV x 1 200 J);  Location: [facility]; Surgeon: Dimas Alexandria, MD   CATARACT EXTRACTION, BILATERAL Bilateral    CESAREAN SECTION     x3   ctr     x3   FRACTURE SURGERY     rt leg in high school lt ankle 1998   JOINT REPLACEMENT      both knees   REVERSE SHOULDER ARTHROPLASTY Right 08/22/2017   Procedure: REVERSE SHOULDER ARTHROPLASTY;  Surgeon: Lovell Sheehan, MD;  Location: [facility] ORS;  Service: Orthopedics;  Laterality: Right;   No family history on file. Social History   Tobacco Use   Smoking status: Never    Passive exposure: Past   Smokeless tobacco: Never  Vaping Use   Vaping Use: Never used  Substance Use Topics   Alcohol use: Yes    Comment: rare   Drug use: Never    Pertinent Clinical Results:  LABS: Labs reviewed: Acceptable for surgery.  Hospital Outpatient Visit on 10/21/2021  Component Date Value Ref Range Status   WBC 10/21/2021 4.5  4.0 - 10.5 K/uL Final   RBC 10/21/2021 5.00  3.87 - 5.11 MIL/uL Final   Hemoglobin 10/21/2021 15.7 (H)  12.0 - 15.0 g/dL Final   HCT 10/21/2021 47.5 (H)  36.0 - 46.0 % Final   MCV 10/21/2021 95.0  80.0 - 100.0 fL Final   MCH 10/21/2021 31.4  26.0 - 34.0 pg Final   MCHC 10/21/2021 33.1  30.0 - 36.0 g/dL Final   RDW 10/21/2021 12.7  11.5 - 15.5 % Final   Platelets 10/21/2021 180  150 - 400 K/uL Final   nRBC 10/21/2021 0.0  0.0 - 0.2 % Final   Performed at [facility], [address] [place], [place] [address]   Sodium 10/21/2021 140  135 - 145 mmol/L Final   Potassium 10/21/2021 4.0  3.5 - 5.1 mmol/L Final   Chloride 10/21/2021 103  98 - 111 mmol/L Final   CO2 10/21/2021 28  22 - 32 mmol/L Final   Glucose, Bld 10/21/2021 103 (H)  70 - 99 mg/dL Final   Glucose reference range applies only to samples taken after fasting for at least 8 hours.   BUN 10/21/2021 15  8 - 23 mg/dL Final   Creatinine, Ser 10/21/2021 0.86  0.44 - 1.00 mg/dL Final   Calcium 10/21/2021 9.3  8.9 - 10.3 mg/dL Final   GFR, Estimated 10/21/2021 >60  >60 mL/min Final   Comment: (NOTE) Calculated using the CKD-EPI Creatinine Equation (2021)    Anion gap 10/21/2021 9  5 - 15 Final   Performed at [facility], [place],  [address]   Color, Urine  10/21/2021 STRAW (A)  YELLOW Final   APPearance 10/21/2021 CLEAR (A)  CLEAR Final   Specific Gravity, Urine 10/21/2021 1.006  1.005 - 1.030 Final   pH 10/21/2021 8.0  5.0 - 8.0 Final   Glucose, UA 10/21/2021 NEGATIVE  NEGATIVE mg/dL Final   Hgb urine dipstick 10/21/2021 MODERATE (A)  NEGATIVE Final   Bilirubin Urine 10/21/2021 NEGATIVE  NEGATIVE Final   Ketones, ur 10/21/2021 NEGATIVE  NEGATIVE mg/dL Final   Protein, ur 10/21/2021 NEGATIVE  NEGATIVE mg/dL Final   Nitrite 10/21/2021 NEGATIVE  NEGATIVE Final   Leukocytes,Ua 10/21/2021 NEGATIVE  NEGATIVE Final   RBC / HPF 10/21/2021 6-10  0 - 5 RBC/hpf Final   WBC, UA 10/21/2021 0-5  0 - 5 WBC/hpf Final   Bacteria, UA 10/21/2021 RARE (A)  NONE SEEN Final   Squamous Epithelial / LPF 10/21/2021 0-5  0 - 5 Final   Mucus 10/21/2021 PRESENT   Final   Performed at [facility], [address] [place], [place] [address]   ABO/RH(D) 10/21/2021 A NEG   Final   Antibody Screen 10/21/2021 NEG   Final   Sample Expiration 10/21/2021 11/04/2021,2359   Final   Extend sample reason 10/21/2021    Final                   Value:NO TRANSFUSIONS OR PREGNANCY IN THE PAST 3 MONTHS Performed at [facility], [address] [place], [place] [address]    MRSA, PCR 10/21/2021 NEGATIVE  NEGATIVE Final   Staphylococcus aureus 10/21/2021 POSITIVE (A)  NEGATIVE Final   Comment: (NOTE) The Xpert SA Assay (FDA approved for NASAL specimens in patients 28 years of age and older), is one component of a comprehensive surveillance program. It is not intended to diagnose infection nor to guide or monitor treatment. Performed at [facility], [address] [place], [place] [address]     ECG: Date: 10/27/2021 Time ECG obtained: *** {Time; am/pm:31393} Rate: *** bpm Rhythm: {CHL RHYTHM BASELINE EKG FOR EVO:35009381} Axis (leads I and aVF): {Left-right-normal:60277::"***"} Normal (up/up).Marland KitchenMarland KitchenLEFT axis (up/down)...RIGHT axis  (down/up).Marland KitchenMarland KitchenEXTREME axis (down/down) Intervals: PR *** ms. QRS *** ms. QTc *** ms. ST segment and T wave changes: No evidence of acute ST segment elevation or depression Comparison: Similar to previous tracing obtained on *** No previous tracings available for review and comparison.   IMAGING / PROCEDURES: TRANSTHORACIC ECHOCARDIOGRAM performed on 12/19/2020 The left ventricle is normal in size with mildly increased wall thickness.  The left ventricular systolic function is normal, LVEF is visually estimated at > 55%.  The left atrium is severely dilated in size.  The right atrium is severely dilated  in size.  The right ventricle is normal in size, with normal systolic function.  Rhythm: Atrial flutter (rate controlled).   MYOCARDIAL PERFUSION IMAGING STUDY (LEXISCAN) performed on 06/02/2017 Normal myocardial perfusion study  Comparison of the stress and rest imaging revealed homogeneous radioisotope tracer uptake with no stress-induced perfusion abnormalities.  Post stress: Global systolic function is normal. The ejection fraction calculated at 55%.   Impression and Plan:  Sonya Cameron has been referred for pre-anesthesia review and clearance prior to her undergoing the planned anesthetic and procedural courses. Available labs, pertinent testing, and imaging results were personally reviewed by me. This patient has been appropriately cleared by cardiology with an overall *** risk of significant perioperative cardiovascular complications.  Based on clinical review performed today (10/27/21), barring any significant acute changes in the patient's overall condition, it is anticipated that she will be able to proceed with the planned surgical intervention. Any acute changes in clinical condition may necessitate her procedure being postponed and/or cancelled. Patient will meet with anesthesia team (MD and/or CRNA) on the day of her procedure for preoperative evaluation/assessment. Questions  regarding anesthetic course will be fielded at that time.   Pre-surgical instructions were reviewed with the patient during her PAT appointment and questions were fielded by PAT clinical staff. Patient was advised that if any questions or concerns arise prior to her procedure then she should return a call to PAT and/or her surgeon's office to discuss.  Honor Loh, MSN, APRN, FNP-C, CEN [place]  Peri-operative Services Nurse Practitioner Phone: [phone]  Fax: [phone] 10/27/21 8:44 AM  NOTE: This note has been prepared using Dragon dictation software. Despite my best ability to proofread, there is always the potential that unintentional transcriptional errors may still occur from this process.

## 2021-11-02 MED ORDER — CEFAZOLIN SODIUM-DEXTROSE 2-4 GM/100ML-% IV SOLN
2.0000 g | INTRAVENOUS | Status: AC
  Administered 2021-11-03: 2 g via INTRAVENOUS

## 2021-11-02 MED ORDER — CHLORHEXIDINE GLUCONATE 0.12 % MT SOLN: 15.0000 mL | Freq: Once | OROMUCOSAL | Status: AC

## 2021-11-02 MED ORDER — POVIDONE-IODINE 10 % EX SWAB: 2.0000 | Freq: Once | CUTANEOUS | Status: DC

## 2021-11-02 MED ORDER — ORAL CARE MOUTH RINSE: 15.0000 mL | Freq: Once | OROMUCOSAL | Status: AC

## 2021-11-02 MED ORDER — FAMOTIDINE 20 MG PO TABS: 20.0000 mg | ORAL_TABLET | Freq: Once | ORAL | Status: AC

## 2021-11-02 MED ORDER — TRANEXAMIC ACID-NACL 1000-0.7 MG/100ML-% IV SOLN
1000.0000 mg | INTRAVENOUS | Status: AC
  Administered 2021-11-03: 1000 mg via INTRAVENOUS

## 2021-11-03 ENCOUNTER — Inpatient Hospital Stay: Payer: Medicare Other

## 2021-11-03 ENCOUNTER — Inpatient Hospital Stay: Payer: Medicare Other | Admitting: Urgent Care

## 2021-11-03 ENCOUNTER — Encounter: Payer: Self-pay | Admitting: Orthopedic Surgery

## 2021-11-03 ENCOUNTER — Other Ambulatory Visit: Payer: Self-pay

## 2021-11-03 ENCOUNTER — Encounter
Admission: RE | Disposition: A | Discharge status: Home Health | Payer: Self-pay | Source: Home / Self Care | Attending: Orthopedic Surgery

## 2021-11-03 ENCOUNTER — Observation Stay
Admission: RE | Admit: 2021-11-03 | Discharge: 2021-11-04 | Disposition: A | Discharge status: Home Health | Payer: Medicare Other | Attending: Orthopedic Surgery | Admitting: Orthopedic Surgery

## 2021-11-03 DIAGNOSIS — E039 Hypothyroidism, unspecified: Secondary | ICD-10-CM | POA: Diagnosis not present

## 2021-11-03 DIAGNOSIS — I10 Essential (primary) hypertension: Secondary | ICD-10-CM | POA: Diagnosis not present

## 2021-11-03 DIAGNOSIS — I4891 Unspecified atrial fibrillation: Secondary | ICD-10-CM | POA: Insufficient documentation

## 2021-11-03 DIAGNOSIS — Z96641 Presence of right artificial hip joint: Principal | ICD-10-CM

## 2021-11-03 DIAGNOSIS — M1611 Unilateral primary osteoarthritis, right hip: Principal | ICD-10-CM | POA: Insufficient documentation

## 2021-11-03 DIAGNOSIS — Z85828 Personal history of other malignant neoplasm of skin: Secondary | ICD-10-CM | POA: Insufficient documentation

## 2021-11-03 DIAGNOSIS — J449 Chronic obstructive pulmonary disease, unspecified: Secondary | ICD-10-CM | POA: Insufficient documentation

## 2021-11-03 DIAGNOSIS — Z7901 Long term (current) use of anticoagulants: Secondary | ICD-10-CM | POA: Insufficient documentation

## 2021-11-03 HISTORY — DX: Dizziness and giddiness: R42

## 2021-11-03 HISTORY — DX: Cardiomegaly: I51.7

## 2021-11-03 HISTORY — DX: Endothelial corneal dystrophy, bilateral: H18.513

## 2021-11-03 HISTORY — DX: Nonexudative age-related macular degeneration, unspecified eye, stage unspecified: H35.3190

## 2021-11-03 HISTORY — PX: TOTAL HIP ARTHROPLASTY: SHX124

## 2021-11-03 HISTORY — DX: Age-related nuclear cataract, bilateral: H25.13

## 2021-11-03 LAB — ABO/RH: ABO/RH(D): A NEG

## 2021-11-03 SURGERY — ARTHROPLASTY, HIP, TOTAL, ANTERIOR APPROACH
Anesthesia: General | Site: Hip | Laterality: Right

## 2021-11-03 MED ORDER — SODIUM CHLORIDE 0.9 % IR SOLN
Status: DC | PRN
  Administered 2021-11-03: 3000 mL
  Administered 2021-11-03: 250 mL

## 2021-11-03 MED ORDER — BUPIVACAINE-EPINEPHRINE (PF) 0.25% -1:200000 IJ SOLN
INTRAMUSCULAR | Status: AC
  Filled 2021-11-03: qty 30

## 2021-11-03 MED ORDER — BUPIVACAINE-MELOXICAM ER 200-6 MG/7ML IJ SOLN
INTRAMUSCULAR | Status: AC
  Filled 2021-11-03: qty 1

## 2021-11-03 MED ORDER — FENTANYL CITRATE (PF) 100 MCG/2ML IJ SOLN
INTRAMUSCULAR | Status: DC | PRN
  Administered 2021-11-03 (×2): 50 ug via INTRAVENOUS

## 2021-11-03 MED ORDER — PHENYLEPHRINE HCL-NACL 20-0.9 MG/250ML-% IV SOLN
INTRAVENOUS | Status: AC
  Filled 2021-11-03: qty 250

## 2021-11-03 MED ORDER — OXYCODONE HCL 5 MG PO TABS
5.0000 mg | ORAL_TABLET | Freq: Once | ORAL | Status: AC | PRN
  Administered 2021-11-03: 5 mg via ORAL

## 2021-11-03 MED ORDER — ACETAMINOPHEN 10 MG/ML IV SOLN
1000.0000 mg | Freq: Once | INTRAVENOUS | Status: DC | PRN
Start: 2021-11-03 — End: 2021-11-03

## 2021-11-03 MED ORDER — METOCLOPRAMIDE HCL 5 MG PO TABS: 5.0000 mg | ORAL_TABLET | Freq: Three times a day (TID) | ORAL | Status: DC | PRN

## 2021-11-03 MED ORDER — ASPIRIN 81 MG PO CHEW
81.0000 mg | CHEWABLE_TABLET | Freq: Two times a day (BID) | ORAL | Status: DC
  Administered 2021-11-03 – 2021-11-04 (×2): 81 mg via ORAL
  Filled 2021-11-03 (×2): qty 1

## 2021-11-03 MED ORDER — OXYCODONE HCL 5 MG PO TABS
ORAL_TABLET | ORAL | Status: AC
  Filled 2021-11-03: qty 1

## 2021-11-03 MED ORDER — ACETAMINOPHEN 325 MG PO TABS: 325.0000 mg | ORAL_TABLET | Freq: Four times a day (QID) | ORAL | Status: DC | PRN

## 2021-11-03 MED ORDER — 0.9 % SODIUM CHLORIDE (POUR BTL) OPTIME
TOPICAL | Status: DC | PRN
  Administered 2021-11-03: 1000 mL

## 2021-11-03 MED ORDER — PHENYLEPHRINE HCL-NACL 20-0.9 MG/250ML-% IV SOLN
INTRAVENOUS | Status: DC | PRN
  Administered 2021-11-03: 30 ug/min via INTRAVENOUS

## 2021-11-03 MED ORDER — LACTATED RINGERS IV SOLN: INTRAVENOUS | Status: DC

## 2021-11-03 MED ORDER — KETOROLAC TROMETHAMINE 15 MG/ML IJ SOLN
7.5000 mg | Freq: Four times a day (QID) | INTRAMUSCULAR | Status: AC
  Administered 2021-11-03 (×2): 7.5 mg via INTRAVENOUS
  Filled 2021-11-03 (×2): qty 1

## 2021-11-03 MED ORDER — PHENOL 1.4 % MT LIQD: 1.0000 | OROMUCOSAL | Status: DC | PRN

## 2021-11-03 MED ORDER — ALBUTEROL SULFATE (2.5 MG/3ML) 0.083% IN NEBU: 2.5000 mg | INHALATION_SOLUTION | Freq: Four times a day (QID) | RESPIRATORY_TRACT | Status: DC | PRN

## 2021-11-03 MED ORDER — ONDANSETRON HCL 4 MG/2ML IJ SOLN
4.0000 mg | Freq: Once | INTRAMUSCULAR | Status: DC | PRN
Start: 2021-11-03 — End: 2021-11-03

## 2021-11-03 MED ORDER — ACETAMINOPHEN 10 MG/ML IV SOLN
INTRAVENOUS | Status: AC
  Filled 2021-11-03: qty 100

## 2021-11-03 MED ORDER — BUPIVACAINE-EPINEPHRINE (PF) 0.25% -1:200000 IJ SOLN
INTRAMUSCULAR | Status: DC | PRN
  Administered 2021-11-03: 20 mL

## 2021-11-03 MED ORDER — MECLIZINE HCL 25 MG PO TABS: 25.0000 mg | ORAL_TABLET | Freq: Two times a day (BID) | ORAL | Status: DC | PRN

## 2021-11-03 MED ORDER — FAMOTIDINE 20 MG PO TABS
ORAL_TABLET | ORAL | Status: AC
  Administered 2021-11-03: 20 mg via ORAL
  Filled 2021-11-03: qty 1

## 2021-11-03 MED ORDER — CEFAZOLIN SODIUM-DEXTROSE 2-4 GM/100ML-% IV SOLN
2.0000 g | Freq: Four times a day (QID) | INTRAVENOUS | Status: AC
  Administered 2021-11-03 (×2): 2 g via INTRAVENOUS
  Filled 2021-11-03 (×2): qty 100

## 2021-11-03 MED ORDER — MAGNESIUM HYDROXIDE 400 MG/5ML PO SUSP
30.0000 mL | Freq: Every day | ORAL | Status: DC | PRN
  Administered 2021-11-04: 30 mL via ORAL
  Filled 2021-11-03: qty 30

## 2021-11-03 MED ORDER — MENTHOL 3 MG MT LOZG: 1.0000 | LOZENGE | OROMUCOSAL | Status: DC | PRN

## 2021-11-03 MED ORDER — METOPROLOL SUCCINATE ER 25 MG PO TB24
25.0000 mg | ORAL_TABLET | Freq: Every day | ORAL | Status: DC
  Administered 2021-11-03: 25 mg via ORAL
  Filled 2021-11-03: qty 1

## 2021-11-03 MED ORDER — CHLORHEXIDINE GLUCONATE 0.12 % MT SOLN
OROMUCOSAL | Status: AC
  Administered 2021-11-03: 15 mL via OROMUCOSAL
  Filled 2021-11-03: qty 15

## 2021-11-03 MED ORDER — PROPOFOL 1000 MG/100ML IV EMUL
INTRAVENOUS | Status: AC
  Filled 2021-11-03: qty 100

## 2021-11-03 MED ORDER — CEFAZOLIN SODIUM-DEXTROSE 2-4 GM/100ML-% IV SOLN
INTRAVENOUS | Status: AC
  Filled 2021-11-03: qty 100

## 2021-11-03 MED ORDER — PROPOFOL 10 MG/ML IV BOLUS
INTRAVENOUS | Status: DC | PRN
  Administered 2021-11-03: 20 mg via INTRAVENOUS

## 2021-11-03 MED ORDER — HYDROMORPHONE HCL 1 MG/ML IJ SOLN: 0.5000 mg | INTRAMUSCULAR | Status: DC | PRN

## 2021-11-03 MED ORDER — ONDANSETRON HCL 4 MG/2ML IJ SOLN: 4.0000 mg | Freq: Four times a day (QID) | INTRAMUSCULAR | Status: DC | PRN

## 2021-11-03 MED ORDER — ONDANSETRON HCL 4 MG PO TABS: 4.0000 mg | ORAL_TABLET | Freq: Four times a day (QID) | ORAL | Status: DC | PRN

## 2021-11-03 MED ORDER — BUPIVACAINE HCL (PF) 0.5 % IJ SOLN
INTRAMUSCULAR | Status: DC | PRN
  Administered 2021-11-03: 2.5 mL

## 2021-11-03 MED ORDER — DOCUSATE SODIUM 100 MG PO CAPS
100.0000 mg | ORAL_CAPSULE | Freq: Two times a day (BID) | ORAL | Status: DC
  Administered 2021-11-03 – 2021-11-04 (×2): 100 mg via ORAL
  Filled 2021-11-03 (×2): qty 1

## 2021-11-03 MED ORDER — OXYCODONE HCL 5 MG/5ML PO SOLN: 5.0000 mg | Freq: Once | ORAL | Status: AC | PRN

## 2021-11-03 MED ORDER — ALUM & MAG HYDROXIDE-SIMETH 200-200-20 MG/5ML PO SUSP: 30.0000 mL | ORAL | Status: DC | PRN

## 2021-11-03 MED ORDER — BISACODYL 10 MG RE SUPP: 10.0000 mg | Freq: Every day | RECTAL | Status: DC | PRN

## 2021-11-03 MED ORDER — SURGIRINSE WOUND IRRIGATION SYSTEM - OPTIME
TOPICAL | Status: DC | PRN
  Administered 2021-11-03: 450 mL

## 2021-11-03 MED ORDER — FENTANYL CITRATE (PF) 100 MCG/2ML IJ SOLN: 25.0000 ug | INTRAMUSCULAR | Status: DC | PRN

## 2021-11-03 MED ORDER — METOCLOPRAMIDE HCL 5 MG/ML IJ SOLN: 5.0000 mg | Freq: Three times a day (TID) | INTRAMUSCULAR | Status: DC | PRN

## 2021-11-03 MED ORDER — ENALAPRIL MALEATE 10 MG PO TABS
10.0000 mg | ORAL_TABLET | Freq: Every day | ORAL | Status: DC
Start: 2021-11-03 — End: 2021-11-04
  Administered 2021-11-03: 10 mg via ORAL
  Filled 2021-11-03: qty 1

## 2021-11-03 MED ORDER — MAGNESIUM CITRATE PO SOLN
1.0000 | Freq: Once | ORAL | Status: DC | PRN
Start: 2021-11-03 — End: 2021-11-04

## 2021-11-03 MED ORDER — DEXAMETHASONE SODIUM PHOSPHATE 10 MG/ML IJ SOLN
INTRAMUSCULAR | Status: DC | PRN
  Administered 2021-11-03: 10 mg via INTRAVENOUS

## 2021-11-03 MED ORDER — ONDANSETRON HCL 4 MG/2ML IJ SOLN
INTRAMUSCULAR | Status: DC | PRN
  Administered 2021-11-03: 4 mg via INTRAVENOUS

## 2021-11-03 MED ORDER — OXYCODONE HCL 5 MG PO TABS
5.0000 mg | ORAL_TABLET | ORAL | Status: DC | PRN
  Administered 2021-11-03: 5 mg via ORAL
  Filled 2021-11-03: qty 2

## 2021-11-03 MED ORDER — KETOROLAC TROMETHAMINE 15 MG/ML IJ SOLN
INTRAMUSCULAR | Status: AC
  Administered 2021-11-04: 7.5 mg via INTRAVENOUS
  Filled 2021-11-03: qty 1

## 2021-11-03 MED ORDER — LEVOTHYROXINE SODIUM 50 MCG PO TABS
50.0000 ug | ORAL_TABLET | Freq: Every day | ORAL | Status: DC
  Administered 2021-11-04: 50 ug via ORAL
  Filled 2021-11-03: qty 1

## 2021-11-03 MED ORDER — TRANEXAMIC ACID-NACL 1000-0.7 MG/100ML-% IV SOLN
INTRAVENOUS | Status: AC
  Filled 2021-11-03: qty 100

## 2021-11-03 MED ORDER — PROPOFOL 500 MG/50ML IV EMUL
INTRAVENOUS | Status: DC | PRN
  Administered 2021-11-03: 50 ug/kg/min via INTRAVENOUS

## 2021-11-03 MED ORDER — BUPIVACAINE-MELOXICAM ER 200-6 MG/7ML IJ SOLN
INTRAMUSCULAR | Status: DC | PRN
  Administered 2021-11-03: 200 mL

## 2021-11-03 MED ORDER — BUPIVACAINE HCL (PF) 0.5 % IJ SOLN
INTRAMUSCULAR | Status: AC
  Filled 2021-11-03: qty 10

## 2021-11-03 MED ORDER — APIXABAN 5 MG PO TABS
5.0000 mg | ORAL_TABLET | Freq: Two times a day (BID) | ORAL | Status: DC
  Administered 2021-11-04: 5 mg via ORAL
  Filled 2021-11-03: qty 1

## 2021-11-03 MED ORDER — ACETAMINOPHEN 10 MG/ML IV SOLN
INTRAVENOUS | Status: DC | PRN
  Administered 2021-11-03: 1000 mg via INTRAVENOUS

## 2021-11-03 MED ORDER — FENTANYL CITRATE (PF) 100 MCG/2ML IJ SOLN
INTRAMUSCULAR | Status: AC
  Filled 2021-11-03: qty 2

## 2021-11-03 SURGICAL SUPPLY — 56 items
BLADE SAGITTAL WIDE XTHICK NO (BLADE) ×1 IMPLANT
BNDG COHESIVE 4X5 TAN STRL LF (GAUZE/BANDAGES/DRESSINGS) ×2 IMPLANT
BRUSH SCRUB EZ  4% CHG (MISCELLANEOUS) ×1
BRUSH SCRUB EZ 4% CHG (MISCELLANEOUS) ×1 IMPLANT
CHLORAPREP W/TINT 26 (MISCELLANEOUS) ×1 IMPLANT
COVER HOLE (Hips) IMPLANT
CUP R3 52MM (Hips) IMPLANT
DRAPE 3/4 80X56 (DRAPES) ×1 IMPLANT
DRAPE C-ARM 42X72 X-RAY (DRAPES) ×1 IMPLANT
DRAPE STERI IOBAN 125X83 (DRAPES) IMPLANT
DRAPE U-SHAPE 47X51 STRL (DRAPES) ×1 IMPLANT
DRSG AQUACEL AG ADV 3.5X10 (GAUZE/BANDAGES/DRESSINGS) IMPLANT
DRSG AQUACEL AG ADV 3.5X14 (GAUZE/BANDAGES/DRESSINGS) IMPLANT
ELECT REM PT RETURN 9FT ADLT (ELECTROSURGICAL) ×1
ELECTRODE REM PT RTRN 9FT ADLT (ELECTROSURGICAL) ×1 IMPLANT
GAUZE 4X4 16PLY ~~LOC~~+RFID DBL (SPONGE) ×1 IMPLANT
GAUZE XEROFORM 1X8 LF (GAUZE/BANDAGES/DRESSINGS) IMPLANT
GLOVE BIO SURGEON STRL SZ8 (GLOVE) ×1 IMPLANT
GLOVE BIOGEL PI IND STRL 8.5 (GLOVE) ×2 IMPLANT
GLOVE BIOGEL PI INDICATOR 8.5 (GLOVE) ×2
GLOVE SURG ORTHO 8.5 STRL (GLOVE) ×1 IMPLANT
GOWN STRL REUS W/ TWL XL LVL3 (GOWN DISPOSABLE) ×2 IMPLANT
GOWN STRL REUS W/TWL XL LVL3 (GOWN DISPOSABLE) ×2
HEAD FEM KNEE TAPER 36MM XS-3 (Head) IMPLANT
HOOD PEEL AWAY FLYTE STAYCOOL (MISCELLANEOUS) ×3 IMPLANT
IV NS 250ML (IV SOLUTION) ×1
IV NS 250ML BAXH (IV SOLUTION) IMPLANT
IV NS IRRIG 3000ML ARTHROMATIC (IV SOLUTION) ×1 IMPLANT
KIT PATIENT CARE HANA TABLE (KITS) ×1 IMPLANT
KIT TURNOVER CYSTO (KITS) ×1 IMPLANT
LINER ACETAB 0 DEG (Liner) IMPLANT
MANIFOLD NEPTUNE II (INSTRUMENTS) ×1 IMPLANT
MAT ABSORB  FLUID 56X50 GRAY (MISCELLANEOUS) ×1
MAT ABSORB FLUID 56X50 GRAY (MISCELLANEOUS) ×1 IMPLANT
NDL SAFETY ECLIP 18X1.5 (MISCELLANEOUS) IMPLANT
NDL SPNL 20GX3.5 QUINCKE YW (NEEDLE) ×1 IMPLANT
NEEDLE HYPO 22GX1.5 SAFETY (NEEDLE) ×1 IMPLANT
NEEDLE SPNL 20GX3.5 QUINCKE YW (NEEDLE) ×1 IMPLANT
PACK HIP PROSTHESIS (MISCELLANEOUS) ×1 IMPLANT
PADDING CAST BLEND 4X4 NS (MISCELLANEOUS) ×2 IMPLANT
PILLOW ABDUCTION MEDIUM (MISCELLANEOUS) ×1 IMPLANT
PULSAVAC PLUS IRRIG FAN TIP (DISPOSABLE) ×1
SCREW 6.5X25MM (Screw) IMPLANT
SOLUTION IRRIG SURGIPHOR (IV SOLUTION) ×1 IMPLANT
SPONGE T-LAP 18X18 ~~LOC~~+RFID (SPONGE) ×4 IMPLANT
STAPLER SKIN PROX 35W (STAPLE) ×1 IMPLANT
STEM POLAR STD S6 12/14 COLLAR (Stem) IMPLANT
SUT BONE WAX W31G (SUTURE) ×1 IMPLANT
SUT DVC 2 QUILL PDO  T11 36X36 (SUTURE) ×1
SUT DVC 2 QUILL PDO T11 36X36 (SUTURE) ×1 IMPLANT
SUT VIC AB 2-0 CT1 18 (SUTURE) ×1 IMPLANT
SYR 20ML LL LF (SYRINGE) ×1 IMPLANT
TIP FAN IRRIG PULSAVAC PLUS (DISPOSABLE) ×1 IMPLANT
TRAP FLUID SMOKE EVACUATOR (MISCELLANEOUS) ×1 IMPLANT
WAND WEREWOLF FASTSEAL 6.0 (MISCELLANEOUS) ×1 IMPLANT
WATER STERILE IRR 500ML POUR (IV SOLUTION) ×1 IMPLANT

## 2021-11-03 NOTE — Anesthesia Procedure Notes (Addendum)
Spinal  Patient location during procedure: OR Reason for block: surgical anesthesia Staffing Performed: resident/CRNA and other anesthesia staff  Resident/CRNA: Rolla Plate, CRNA Other anesthesia staff: Lenord Fellers, RN Performed by: Rolla Plate, CRNA Authorized by: Darrin Nipper, MD   Preanesthetic Checklist Completed: patient identified, IV checked, site marked, risks and benefits discussed, surgical consent, monitors and equipment checked, pre-op evaluation and timeout performed Spinal Block Patient position: sitting Prep: ChloraPrep and site prepped and draped Patient monitoring: heart rate, continuous pulse ox, blood pressure and cardiac monitor Approach: midline Location: L4-5 Injection technique: single-shot Needle Needle type: Whitacre and Introducer  Needle gauge: 24 G Needle length: 9 cm Assessment Events: CSF return Additional Notes Negative paresthesia. Negative blood return. Positive free-flowing CSF. Expiration date of kit checked and confirmed. Patient tolerated procedure well, without complications.

## 2021-11-03 NOTE — Progress Notes (Signed)
Notified family that patient is going to 146 on the first floor, and that she is doing great

## 2021-11-03 NOTE — H&P (Signed)
The patient has been re-examined, and the chart reviewed, and there have been no interval changes to the documented history and physical.  Plan a right total hip today.  Anesthesia is not consulted regarding a peripheral nerve block for post-operative pain.  The risks, benefits, and alternatives have been discussed at length, and the patient is willing to proceed.    

## 2021-11-03 NOTE — Evaluation (Signed)
Physical Therapy Evaluation Patient Details Name: Sonya Cameron MRN: [id number] DOB: April 02, 1934 Today's Date: 11/03/2021  History of Present Illness  87 y/o female s/p R total hip (anterior approach) 8/29.  Clinical Impression  Pt did very well with PT exam and subsequent treatment and showed good strength, mobility and was able to ambulate ~50 ft with relatively confident cadence.  Pt with some pain, that increased minimal with exercises, but was not limiting.  She did have some fatigue with the bout of ambulation with SpO2 dropping to ~90 and HR up to 110s.   Overall pt doing well and showed great strength and effort POD0.       Recommendations for follow up therapy are one component of a multi-disciplinary discharge planning process, led by the attending physician.  Recommendations may be updated based on patient status, additional functional criteria and insurance authorization.  Follow Up Recommendations Follow physician's recommendations for discharge plan and follow up therapies      Assistance Recommended at Discharge Set up Supervision/Assistance  Patient can return home with the following  A little help with walking and/or transfers;A little help with bathing/dressing/bathroom;Assistance with cooking/housework;Assist for transportation    Equipment Recommendations None recommended by PT  Recommendations for Other Services       Functional Status Assessment Patient has had a recent decline in their functional status and demonstrates the ability to make significant improvements in function in a reasonable and predictable amount of time.     Precautions / Restrictions Precautions Precautions: Anterior Hip;None;Fall Precaution Booklet Issued: Yes (comment) (HEP) Restrictions Weight Bearing Restrictions: Yes RLE Weight Bearing: Weight bearing as tolerated      Mobility  Bed Mobility Overal bed mobility: Modified Independent             General bed mobility  comments: Pt able to get herself up to sitting with relative ease, rail use but no direct assist    Transfers Overall transfer level: Modified independent Equipment used: Rolling walker (2 wheels)               General transfer comment: Pt was able to rise to standing, w/o direct assist, X 2 with only minimal cuing and    Ambulation/Gait Ambulation/Gait assistance: Min guard Gait Distance (Feet): 50 Feet Assistive device: Rolling walker (2 wheels)         General Gait Details: Pt initially with step-to gait that did, increase to consistent walker motion and more confident cadence  Stairs            Wheelchair Mobility    Modified Rankin (Stroke Patients Only)       Balance Overall balance assessment: Modified Independent                                           Pertinent Vitals/Pain Pain Assessment Pain Assessment: 0-10 Pain Score: 2  Pain Location: R hip    Home Living Family/patient expects to be discharged to:: Private residence Living Arrangements: Children Available Help at Discharge: Available 24 hours/day   Home Access:  (gravel drive to small threshold)       Home Layout: One level Home Equipment: Conservation officer, nature (2 wheels);Rollator (4 wheels);Cane - single point;BSC/3in1      Prior Function Prior Level of Function : Independent/Modified Independent;Driving             Mobility Comments: able to stay active,  drives and runs errands       Hand Dominance        Extremity/Trunk Assessment   Upper Extremity Assessment Upper Extremity Assessment: Generalized weakness    Lower Extremity Assessment Lower Extremity Assessment: Overall WFL for tasks assessed       Communication   Communication: No difficulties  Cognition Arousal/Alertness: Awake/alert Behavior During Therapy: WFL for tasks assessed/performed Overall Cognitive Status: Within Functional Limits for tasks assessed                                           General Comments      Exercises Total Joint Exercises Ankle Circles/Pumps: AROM, 10 reps Quad Sets: Strengthening, 10 reps Heel Slides: AROM, 10 reps (with resisted leg ext) Hip ABduction/ADduction: Strengthening, 5 reps   Assessment/Plan    PT Assessment Patient needs continued PT services  PT Problem List Decreased strength;Decreased range of motion;Decreased activity tolerance;Decreased balance;Decreased mobility;Decreased knowledge of use of DME;Decreased safety awareness;Pain       PT Treatment Interventions DME instruction;Gait training;Functional mobility training;Therapeutic activities;Therapeutic exercise;Balance training;Neuromuscular re-education;Stair training;Patient/family education    PT Goals (Current goals can be found in the Care Plan section)  Acute Rehab PT Goals Patient Stated Goal: go home tomorrow PT Goal Formulation: With patient Time For Goal Achievement: 11/16/21 Potential to Achieve Goals: Good    Frequency BID     Co-evaluation               AM-PAC PT "6 Clicks" Mobility  Outcome Measure Help needed turning from your back to your side while in a flat bed without using bedrails?: None Help needed moving from lying on your back to sitting on the side of a flat bed without using bedrails?: None Help needed moving to and from a bed to a chair (including a wheelchair)?: A Little Help needed standing up from a chair using your arms (e.g., wheelchair or bedside chair)?: None Help needed to walk in hospital room?: A Little Help needed climbing 3-5 steps with a railing? : A Little 6 Click Score: 21    End of Session Equipment Utilized During Treatment: Gait belt Activity Tolerance: Patient tolerated treatment well Patient left: with chair alarm set;with call bell/phone within reach Nurse Communication: Mobility status PT Visit Diagnosis: Muscle weakness (generalized) (M62.81);Difficulty in walking, not elsewhere  classified (R26.2);Pain Pain - Right/Left: Right Pain - part of body: Hip    Time: 7209-4709 PT Time Calculation (min) (ACUTE ONLY): 33 min   Charges:   PT Evaluation $PT Eval Low Complexity: 1 Low PT Treatments $Gait Training: 8-22 mins $Therapeutic Exercise: 8-22 mins        Kreg Shropshire, DPT 11/03/2021, 5:58 PM

## 2021-11-03 NOTE — Op Note (Signed)
11/03/2021  2:33 PM  PATIENT:  Sonya Cameron   MRN: [id number]  PRE-OPERATIVE DIAGNOSIS:  Osteoarthritis right hip   POST-OPERATIVE DIAGNOSIS: Same  Procedure: Right Total Hip Replacement  Surgeon: Elyn Aquas. Harlow Mares, MD   Assist: Carlynn Spry, PA-C  Anesthesia: Spinal   EBL: 200 mL   Specimens: None   Drains: None   Components used: A size 6 Polarstem [employer]hew, R3 size 52 mm shell, and a 36 mm -3 mm head    Description of the procedure in detail: After informed consent was obtained and the appropriate extremity marked in the pre-operative holding area, the patient was taken to the operating room and placed in the supine position on the fracture table. All pressure points were well padded and bilateral lower extremities were place in traction spars. The hip was prepped and draped in standard sterile fashion. A spinal anesthetic had been delivered by the anesthesia team. The skin and subcutaneous tissues were injected with a mixture of Marcaine with epinephrine for post-operative pain. A longitudinal incision approximately 10 cm in length was carried out from the anterior superior iliac spine to the greater trochanter. The tensor fascia was divided and blunt dissection was taken down to the level of the joint capsule. The lateral circumflex vessels were cauterized. Deep retractors were placed and a portion of the anterior capsule was excised. Using fluoroscopy the neck cut was planned and carried out with a sagittal saw. The head was passed from the field with use of a corkscrew and hip skid. Deep retractors were placed along the acetabulum and the degenerative labrum and large osteophytes were removed with a Rongeur. The cup was sequentially reamed to a size 52 mm. The wound was irrigated and using fluoroscopy the size 52 mm cup was impacted in to anatomic position. A single screw was placed followed by a threaded hole cover. The final liner was impacted in to position. Attention was  then turned to the proximal femur. The leg was placed in extension and external rotation. The canal was opened and sequentially broached to a size 6. The trial components were placed and the hip relocated. The components were found to be in good position using fluoroscopy. The hip was dislocated and the trial components removed. The final components were impacted in to position and the hip relocated. The final components were again check with fluoroscopy and found to be in good position. Hemostasis was achieved with electrocautery. The deep capsule was injected with Marcaine and epinephrine. The wound was irrigated with bacitracin laced normal saline and the tensor fascia closed with #2 Quill suture. The subcutaneous tissues were closed with 2-0 vicryl and staples for the skin. A sterile dressing was applied and an abduction pillow. Patient tolerated the procedure well and there were no apparent complication. Patient was taken to the recovery room in good condition.   Kurtis Bushman, MD

## 2021-11-03 NOTE — Anesthesia Postprocedure Evaluation (Signed)
Anesthesia Post Note  Patient: Wilhemina Grall  Procedure(s) Performed: TOTAL HIP ARTHROPLASTY ANTERIOR APPROACH (Right: Hip)  Patient location during evaluation: PACU Anesthesia Type: Spinal Level of consciousness: awake and alert, oriented and patient cooperative Pain management: pain level controlled Vital Signs Assessment: post-procedure vital signs reviewed and stable Respiratory status: spontaneous breathing, nonlabored ventilation and respiratory function stable Cardiovascular status: blood pressure returned to baseline and stable Postop Assessment: adequate PO intake, no headache and spinal receding Anesthetic complications: no   No notable events documented.   Last Vitals:  Vitals:   11/03/21 1034 11/03/21 1430  BP: (!) 151/105   Pulse: 75   Resp: 16 19  Temp: (!) 36.2 C (!) 36.4 C  SpO2: 97%     Last Pain:  Vitals:   11/03/21 1430  TempSrc:   PainSc: 0-No pain                 Darrin Nipper

## 2021-11-03 NOTE — Anesthesia Preprocedure Evaluation (Addendum)
Anesthesia Evaluation  Patient identified by MRN, date of birth, ID band Patient awake    Reviewed: Allergy & Precautions, NPO status , Patient's Chart, lab work & pertinent test results  History of Anesthesia Complications (+) PONV and history of anesthetic complications  Airway Mallampati: III   Neck ROM: Full    Dental   Crowns :   Pulmonary COPD,    Pulmonary exam normal breath sounds clear to auscultation       Cardiovascular hypertension, Normal cardiovascular exam+ dysrhythmias (a fib on Eliquis, last dose 10/31/21 at [facility])  Rhythm:Regular Rate:Normal  Echo 12/19/20:  1. The left ventricle is normal in size with mildly increased wall thickness. 2. The left ventricular systolic function is normal, LVEF is visually estimated at > 55%. 3. The left atrium is severely dilated in size. 4. The right atrium is severely dilated in size. 5. The right ventricle is normal in size, with normal systolic function. 6. Rhythm: Atrial Flutter (rate controlled).   ECG 07/20/21:  SINUS RHYTHM WITH 1ST DEGREE AV BLOCK  LEFT AXIS DEVIATION  LOW VOLTAGE QRS  Myocardial perfusion 06/02/17:  1. Normal myocardial perfusion study  2. Comparison of the stress and rest imaging revealed homogeneous radioisotope tracer uptake with no stress-induced perfusion abnormalities.  3. Post stress: Global systolic function is normal. The ejection fraction calculated at 55%.    Neuro/Psych Scoliosis     GI/Hepatic negative GI ROS,   Endo/Other  Hypothyroidism Obesity   Renal/GU negative Renal ROS     Musculoskeletal  (+) Arthritis ,   Abdominal   Peds  Hematology negative hematology ROS (+)   Anesthesia Other Findings Reviewed and agree with Bayard Males pre-anesthesia clinical review note.   Cardiology note 07/08/21:  1. Atrial flutter, typical -She has been feeling fatigued and it is unclear whether it is related to atrial flutter or COPD  or her general condition and age, however, atrial fibrillation may certainly be contributing. -CHA2DS2-VASc score 4 (hypertension, age, female gender). She has been started on Eliquis which she has been taking. -She has had discussion about DC cardioversion versus atrial flutter ablation with her primary cardiologist and is here for discussion about that further. -During the last visit we discussed with her about catheter ablation procedure in detail which will be more permanent fix for this and DC cardioversion although with the likely able to bring her back to normal rhythm, that would not eliminate the chance of recurrence. Discussed about catheter ablation procedure in detail with her including the logistics, the details of the procedure as well as the risks of the procedure which include but are not limited to risk of stroke, risk of vascular injury and risk of pericardial effusion requiring drainage. Understanding the risks and potential benefits, she would like to pursue interventional approach for managing atrial flutter. She was waiting for her neurological treatment and is now ready to proceed with the plan for atrial flutter ablation and I have planned for catheter ablation procedure at [facility] to be scheduled in the near future. She has been on regular [place] and will not require TEE for excluding intracardiac thrombus.   Reproductive/Obstetrics                            Anesthesia Physical Anesthesia Plan  ASA: 3  Anesthesia Plan: General and Spinal   Post-op Pain Management:    Induction: Intravenous  PONV Risk Score and Plan: 4 or greater  and Propofol infusion, TIVA, Treatment may vary due to age or medical condition and Ondansetron  Airway Management Planned: Natural Airway and Nasal Cannula  Additional Equipment:   Intra-op Plan:   Post-operative Plan:   Informed Consent: I have reviewed the patients History and Physical, chart, labs and  discussed the procedure including the risks, benefits and alternatives for the proposed anesthesia with the patient or authorized representative who has indicated his/her understanding and acceptance.       Plan Discussed with: CRNA  Anesthesia Plan Comments: (Plan for spinal and GA with natural airway, LMA/GETA backup.  Patient consented for risks of anesthesia including but not limited to:  - adverse reactions to medications - damage to eyes, teeth, lips or other oral mucosa - nerve damage due to positioning  - sore throat or hoarseness - headache, bleeding, infection, nerve damage 2/2 spinal - damage to heart, brain, nerves, lungs, other parts of body or loss of life  Informed patient about role of CRNA in peri- and intra-operative care.  Patient voiced understanding.)       Anesthesia Quick Evaluation

## 2021-11-03 NOTE — Transfer of Care (Signed)
Immediate Anesthesia Transfer of Care Note  Patient: Jonquil Stubbe  Procedure(s) Performed: TOTAL HIP ARTHROPLASTY ANTERIOR APPROACH (Right: Hip)  Patient Location: PACU  Anesthesia Type:Spinal  Level of Consciousness: drowsy  Airway & Oxygen Therapy: Patient Spontanous Breathing and Patient connected to face mask oxygen  Post-op Assessment: Report given to RN and Post -op Vital signs reviewed and stable  Post vital signs: Reviewed and stable  Last Vitals:  Vitals Value Taken Time  BP 91/53 11/03/21 1426  Temp    Pulse 74 11/03/21 1430  Resp 22 11/03/21 1430  SpO2 99 % 11/03/21 1430  Vitals shown include unvalidated device data.  Last Pain:  Vitals:   11/03/21 1034  TempSrc: Oral  PainSc: 1          Complications: No notable events documented.

## 2021-11-04 ENCOUNTER — Encounter: Payer: Self-pay | Admitting: Orthopedic Surgery

## 2021-11-04 DIAGNOSIS — M1611 Unilateral primary osteoarthritis, right hip: Secondary | ICD-10-CM | POA: Diagnosis not present

## 2021-11-04 MED ORDER — METHOCARBAMOL 750 MG PO TABS: 750.0000 mg | ORAL_TABLET | Freq: Three times a day (TID) | ORAL | 0 refills | Status: AC | PRN

## 2021-11-04 MED ORDER — OXYCODONE HCL 5 MG PO TABS: 5.0000 mg | ORAL_TABLET | ORAL | 0 refills | Status: AC | PRN

## 2021-11-04 MED ORDER — DOCUSATE SODIUM 100 MG PO CAPS: 100.0000 mg | ORAL_CAPSULE | Freq: Two times a day (BID) | ORAL | 0 refills | Status: AC

## 2021-11-04 NOTE — Discharge Instructions (Signed)

## 2021-11-04 NOTE — Plan of Care (Signed)

## 2021-11-04 NOTE — Plan of Care (Signed)
  Problem: Activity: Goal: Ability to avoid complications of mobility impairment will improve Outcome: Progressing   Problem: Pain Management: Goal: Pain level will decrease with appropriate interventions Outcome: Progressing   Problem: Activity: Goal: Risk for activity intolerance will decrease Outcome: Progressing   

## 2021-11-04 NOTE — Progress Notes (Signed)
  Subjective:  Patient reports pain as mild to moderate.    Objective:   VITALS:   Vitals:   11/03/21 2014 11/03/21 2342 11/04/21 0407 11/04/21 0748  BP: (!) 134/98 127/87 111/75 111/87  Pulse: 83 86 (!) 55 84  Resp: $Remo'16 16 16 16  'ODqyU$ Temp: 97.6 F (36.4 C) (!) 97.5 F (36.4 C) 98.3 F (36.8 C) 97.6 F (36.4 C)  TempSrc:      SpO2: 94% 94% 91% 92%  Weight:      Height:        PHYSICAL EXAM:  Neurologically intact ABD soft Neurovascular intact Sensation intact distally Intact pulses distally Dorsiflexion/Plantar flexion intact Incision: dressing C/D/I No cellulitis present Compartment soft  LABS  Results for orders placed or performed during the hospital encounter of 11/03/21 (from the past 24 hour(s))  ABO/Rh     Status: None   Collection Time: 11/03/21 10:26 AM  Result Value Ref Range   ABO/RH(D)      A NEG Performed at [facility], [place], [place] [address]     DG HIP UNILAT WITH PELVIS 1V RIGHT  Result Date: 11/03/2021 CLINICAL DATA:  Total right hip arthroplasty. EXAM: DG HIP (WITH OR WITHOUT PELVIS) 1V RIGHT COMPARISON:  None Available. FINDINGS: Images were performed intraoperatively without the presence of a radiologist. Total right hip arthroplasty is seen. No hardware complication. Total fluoroscopy images: 2 Total fluoroscopy time: 9 seconds Total dose: Radiation Exposure Index (as provided by the fluoroscopic device): 1.196 mGy air Kerma Please see intraoperative findings for further detail. IMPRESSION: Intraoperative fluoroscopy for total right hip arthroplasty. Electronically Signed   By: Yvonne Kendall M.D.   On: 11/03/2021 14:35   DG C-Arm 1-60 Min-No Report  Result Date: 11/03/2021 Fluoroscopy was utilized by the requesting physician.  No radiographic interpretation.    Assessment/Plan: 1 Day Post-Op   Principal Problem:   History of total hip replacement, right   Advance diet Up with therapy Discharge home today if  PT goals met  Carlynn Spry , PA-C 11/04/2021, 7:59 AM

## 2021-11-04 NOTE — Discharge Summary (Signed)
Physician Discharge Summary  Patient ID: Sonya Cameron MRN: [id number] DOB/AGE: March 30, 1934 87 y.o.  Admit date: 11/03/2021 Discharge date: 11/04/2021  Admission Diagnoses:  M16.11 Unilateral primary osteoarthritis, right hip History of total hip replacement, right  Discharge Diagnoses:  M16.11 Unilateral primary osteoarthritis, right hip Principal Problem:   History of total hip replacement, right   Past Medical History:  Diagnosis Date   Arthritis    Atrial fibrillation and flutter ([place])    a.) CHA2DS2-VASc = 4 (age x2, sex, HTN);  b.) s/p DCCV (200 J x 1) + CTI cardiac ablation 07/20/2021; c.) rate/rhythm maintained on oral metoprolol succinate; chronically anticoagulated using apixaban   Cardiomegaly    Complication of anesthesia    a.) PONV   COPD (chronic obstructive pulmonary disease) ([place])    Dizziness    a.) uses meclizine PRN   DOE (dyspnea on exertion)    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 female who was admitted 11/03/2021 with a diagnosis of  M16.11 Unilateral primary osteoarthritis, right hip History of total hip replacement, right and went to the operating room on 11/03/2021 and underwent the above named procedures.    She was given perioperative antibiotics:  Anti-infectives (From admission, onward)    Start     Dose/Rate Route Frequency Ordered Stop   11/03/21 1800  ceFAZolin (ANCEF) IVPB 2g/100  mL premix        2 g 200 mL/hr over 30 Minutes Intravenous Every 6 hours 11/03/21 1611 11/04/21 0027   11/03/21 1005  ceFAZolin (ANCEF) 2-4 GM/100ML-% IVPB       Note to Pharmacy: Jeanene Erb E: cabinet override      11/03/21 1005 11/03/21 1226   11/03/21 0600  ceFAZolin (ANCEF) IVPB 2g/100 mL premix        2 g 200 mL/hr over 30 Minutes Intravenous On call to O.R. 11/02/21 2258 11/03/21 1250     .  She was given sequential compression devices, early ambulation, and Eliquis home dose for DVT prophylaxis.  She benefited maximally from the hospital stay and there were no complications.    Recent vital signs:  Vitals:   11/04/21 0407 11/04/21 0748  BP: 111/75 111/87  Pulse: (!) 55 84  Resp: 16 16  Temp: 98.3 F (36.8 C) 97.6 F (36.4 C)  SpO2: 91% 92%    Recent laboratory studies:  Lab Results  Component Value Date   HGB 15.7 (H) 10/21/2021   HGB 13.6 08/23/2017   HGB 15.6 08/10/2017   Lab Results  Component Value Date   WBC 4.5 10/21/2021   PLT 180 10/21/2021   Lab Results  Component Value Date   INR 0.88 08/10/2017   Lab Results  Component Value Date   NA 140 10/21/2021   K 4.0 10/21/2021   CL 103 10/21/2021   CO2 28 10/21/2021   BUN  15 10/21/2021   CREATININE 0.86 10/21/2021   GLUCOSE 103 (H) 10/21/2021    Discharge Medications:   Allergies as of 11/04/2021       Reactions   Tramadol Itching   Sulfamethoxazole-trimethoprim Itching   PT STATES THAT IT "MESSES WITH HER MIND"   Hydrocodone-acetaminophen Other (See Comments)   Headache        Medication List     TAKE these medications    albuterol 108 (90 Base) MCG/ACT inhaler Commonly known as: VENTOLIN HFA Inhale 2 puffs into the lungs every 6 (six) hours as needed for wheezing or shortness of breath.   clindamycin 150 MG capsule Commonly known as: CLEOCIN Take 600 mg by mouth See admin instructions. Take 600 mg by mouth 1 hour prior to dental procedures   docusate sodium 100 MG  capsule Commonly known as: COLACE Take 1 capsule (100 mg total) by mouth 2 (two) times daily.   Eliquis 5 MG Tabs tablet Generic drug: apixaban Take 5 mg by mouth 2 (two) times daily.   enalapril 10 MG tablet Commonly known as: VASOTEC Take 10 mg by mouth at bedtime.   levothyroxine 50 MCG tablet Commonly known as: SYNTHROID Take 50 mcg by mouth daily before breakfast.   meclizine 25 MG tablet Commonly known as: ANTIVERT Take 1 tablet (25 mg total) by mouth 2 (two) times daily as needed for dizziness.   methocarbamol 750 MG tablet Commonly known as: Robaxin-750 Take 1 tablet (750 mg total) by mouth every 8 (eight) hours as needed for muscle spasms.   Metoprolol Succinate 25 MG Cs24 Take 25 mg by mouth at bedtime.   oxyCODONE 5 MG immediate release tablet Commonly known as: Oxy IR/ROXICODONE Take 1 tablet (5 mg total) by mouth every 4 (four) hours as needed for moderate pain (pain score 4-6).               Durable Medical Equipment  (From admission, onward)           Start     Ordered   11/04/21 0806  For home use only DME 3 n 1  Once        11/04/21 0806   11/04/21 0806  For home use only DME Walker rolling  Once       Question Answer Comment  Walker: With 5 Inch Wheels   Patient needs a walker to treat with the following condition Osteoarthritis of right hip      11/04/21 0806            Diagnostic Studies: DG HIP UNILAT WITH PELVIS 1V RIGHT  Result Date: 11/03/2021 CLINICAL DATA:  Total right hip arthroplasty. EXAM: DG HIP (WITH OR WITHOUT PELVIS) 1V RIGHT COMPARISON:  None Available. FINDINGS: Images were performed intraoperatively without the presence of a radiologist. Total right hip arthroplasty is seen. No hardware complication. Total fluoroscopy images: 2 Total fluoroscopy time: 9 seconds Total dose: Radiation Exposure Index (as provided by the fluoroscopic device): 1.196 mGy air Kerma Please see intraoperative findings for further detail.  IMPRESSION: Intraoperative fluoroscopy for total right hip arthroplasty. Electronically Signed   By: Yvonne Kendall M.D.   On: 11/03/2021 14:35   DG C-Arm 1-60 Min-No Report  Result Date: 11/03/2021 Fluoroscopy was utilized by the requesting physician.  No radiographic interpretation.    Disposition: Discharge disposition: 01-Home or Self Care      Discharge home today if PT goals met      Signed: Carlynn Spry ,PA-C 11/04/2021, 8:06 AM

## 2021-11-04 NOTE — Progress Notes (Signed)
DISCHARGE NOTE:  Pt given discharge instructions, and verbalized understanding. Pt wheeled to car by staff, friend providing transportation.

## 2021-11-04 NOTE — Progress Notes (Signed)
Met with the patient in the room, she livers at home with her adult daughter She has DME at home, including a rollator, rolling walker 3 in 1, and cane She does not need additional  Her fiend will be transporting her She has been open for 3 weeks with [facility], they will continue to see her She has an outpatient PT appt on sept 6th at [facility] No additional needs at this time Will dc today

## 2021-11-05 LAB — SURGICAL PATHOLOGY

## 2021-12-01 NOTE — H&P (Signed)
NAME: Sonya Cameron MRN:   [id number] DOB:   10-27-1934     HISTORY AND PHYSICAL  CHIEF COMPLAINT:  right hip pain   HISTORY:   Sonya Cameron a 87 y.o. female  with right  Hip Pain Patient complains of right hip pain. Onset of the symptoms was several years ago. Inciting event: known DJD. The patient reports the hip pain is worse with weight bearing. Associated symptoms: none. Aggravating symptoms include: any weight bearing. Patient has had no prior hip problems. Previous visits for this problem: yes, last seen several weeks ago by Dr. Harlow Mares . Evaluation to date: plain films, which were abnormal  osteoarthritis . Treatment to date: OTC analgesics, which have been somewhat effective, prescription analgesics, which have been somewhat effective, and home exercise program, which has been somewhat effective.    Plan for right total hip replacement   PAST MEDICAL HISTORY:       Past Medical History:  Diagnosis Date   Arthritis     Cancer ([place])      skin   Complication of anesthesia     COPD (chronic obstructive pulmonary disease) ([facility])     Hypertension      no medications   Hypothyroidism     PONV (postoperative nausea and vomiting)      severe x 1   Scoliosis      lumbar, missing disc      PAST SURGICAL HISTORY:        Past Surgical History:  Procedure Laterality Date   BUNIONECTOMY       CESAREAN SECTION        x3   ctr        x3   EYE SURGERY       FRACTURE SURGERY       JOINT REPLACEMENT        both knees   REVERSE SHOULDER ARTHROPLASTY Right 08/22/2017    Procedure: REVERSE SHOULDER ARTHROPLASTY;  Surgeon: Lovell Sheehan, MD;  Location: [facility] ORS;  Service: Orthopedics;  Laterality: Right;      MEDICATIONS:  (Not in a hospital admission)     ALLERGIES:        Allergies  Allergen Reactions   Tramadol Itching   Sulfamethoxazole-Trimethoprim Itching      PT STATES THAT IT "MESSES WITH HER MIND"     Hydrocodone-Acetaminophen Other (See Comments)      Headache         REVIEW OF SYSTEMS:   Negative except HPI   FAMILY HISTORY:  No family history on file.   SOCIAL HISTORY:   reports that she has never smoked. She uses smokeless tobacco. She reports current alcohol use. She reports that she does not use drugs.   PHYSICAL EXAM:  General appearance: alert, cooperative, and no distress Neck: no JVD and supple, symmetrical, trachea midline Resp: clear to auscultation bilaterally GI: soft, non-tender; bowel sounds normal; no masses,  no organomegaly Extremities: extremities normal, atraumatic, no cyanosis or edema and Homans sign is negative, no sign of DVT Pulses: 2+ and symmetric Skin: Skin color, texture, turgor normal. No rashes or lesions Neurologic: Alert and oriented X 3, normal strength and tone. Normal symmetric reflexes. Normal coordination and gait      LABORATORY STUDIES: Recent Labs (last 2 labs)   No results for input(s): "WBC", "HGB", "HCT", "PLT" in the last 72 hours.                Recent Labs (last 2 labs)  No results for input(s): "NA", "K", "CL", "CO2", "GLUCOSE", "BUN", "CREATININE", "CALCIUM" in the last 72 hours.     STUDIES/RESULTS:              Imaging Results  No results found.     ASSESSMENT:  End stage osteoarthritis right hip                              Active Problems:   * No active hospital problems. *     PLAN:  Right Primary Total Hip  Carlynn Spry 12/01/2021. 7:35 AM

## 2022-02-02 MED FILL — Bupivacaine-Meloxicam Injection ER Soln 200-6 MG/7ML: INTRAMUSCULAR | Qty: 1 | Status: AC

## 2022-02-02 MED FILL — Bupivacaine-Meloxicam Injection ER Soln 200-6 MG/7ML: INTRAMUSCULAR | Qty: 1 | Status: CN

## 2024-01-23 ENCOUNTER — Other Ambulatory Visit: Payer: Self-pay | Admitting: Orthopedic Surgery

## 2024-01-23 DIAGNOSIS — Z96611 Presence of right artificial shoulder joint: Secondary | ICD-10-CM

## 2024-01-24 ENCOUNTER — Ambulatory Visit
Admission: RE | Admit: 2024-01-24 | Discharge: 2024-01-24 | Disposition: A | Discharge status: Home / Self Care | Source: Ambulatory Visit | Attending: Orthopedic Surgery | Admitting: Orthopedic Surgery

## 2024-01-24 DIAGNOSIS — Z96611 Presence of right artificial shoulder joint: Secondary | ICD-10-CM | POA: Insufficient documentation
